# Patient Record
Sex: Female | Born: 1955 | Race: White | Hispanic: No | Smoking: Never smoker
Health system: Southern US, Community
[De-identification: ages and names within clinical notes are randomized; demographics above are authoritative.]

## PROBLEM LIST (undated history)

## (undated) HISTORY — PX: HERNIA REPAIR: SHX51

---

## 2016-10-22 ENCOUNTER — Encounter (HOSPITAL_COMMUNITY): Payer: Self-pay | Admitting: Emergency Medicine

## 2016-10-22 ENCOUNTER — Emergency Department (HOSPITAL_COMMUNITY): Payer: BLUE CROSS/BLUE SHIELD

## 2016-10-22 ENCOUNTER — Encounter (HOSPITAL_COMMUNITY): Admission: EM | Disposition: A | Discharge status: Home / Self Care | Payer: Self-pay | Source: Home / Self Care

## 2016-10-22 ENCOUNTER — Inpatient Hospital Stay (HOSPITAL_COMMUNITY)
Admission: EM | Admit: 2016-10-22 | Discharge: 2016-10-27 | DRG: 339 | Disposition: A | Discharge status: Home / Self Care | Payer: BLUE CROSS/BLUE SHIELD | Attending: Surgery | Admitting: Surgery

## 2016-10-22 ENCOUNTER — Observation Stay (HOSPITAL_COMMUNITY): Payer: BLUE CROSS/BLUE SHIELD | Admitting: Registered Nurse

## 2016-10-22 DIAGNOSIS — Z882 Allergy status to sulfonamides status: Secondary | ICD-10-CM

## 2016-10-22 DIAGNOSIS — N83202 Unspecified ovarian cyst, left side: Secondary | ICD-10-CM | POA: Diagnosis present

## 2016-10-22 DIAGNOSIS — K3580 Unspecified acute appendicitis: Secondary | ICD-10-CM | POA: Diagnosis present

## 2016-10-22 DIAGNOSIS — D1803 Hemangioma of intra-abdominal structures: Secondary | ICD-10-CM | POA: Diagnosis present

## 2016-10-22 DIAGNOSIS — K352 Acute appendicitis with generalized peritonitis: Principal | ICD-10-CM | POA: Diagnosis present

## 2016-10-22 DIAGNOSIS — K5900 Constipation, unspecified: Secondary | ICD-10-CM | POA: Diagnosis present

## 2016-10-22 DIAGNOSIS — Z8249 Family history of ischemic heart disease and other diseases of the circulatory system: Secondary | ICD-10-CM

## 2016-10-22 DIAGNOSIS — K353 Acute appendicitis with localized peritonitis, without perforation or gangrene: Secondary | ICD-10-CM

## 2016-10-22 DIAGNOSIS — R509 Fever, unspecified: Secondary | ICD-10-CM

## 2016-10-22 DIAGNOSIS — D696 Thrombocytopenia, unspecified: Secondary | ICD-10-CM | POA: Diagnosis present

## 2016-10-22 DIAGNOSIS — R1031 Right lower quadrant pain: Secondary | ICD-10-CM | POA: Diagnosis not present

## 2016-10-22 DIAGNOSIS — K3532 Acute appendicitis with perforation and localized peritonitis, without abscess: Secondary | ICD-10-CM

## 2016-10-22 DIAGNOSIS — N83201 Unspecified ovarian cyst, right side: Secondary | ICD-10-CM | POA: Diagnosis present

## 2016-10-22 DIAGNOSIS — J9811 Atelectasis: Secondary | ICD-10-CM | POA: Diagnosis not present

## 2016-10-22 DIAGNOSIS — I959 Hypotension, unspecified: Secondary | ICD-10-CM | POA: Diagnosis present

## 2016-10-22 HISTORY — PX: LAPAROSCOPIC APPENDECTOMY: SHX408

## 2016-10-22 LAB — COMPREHENSIVE METABOLIC PANEL
ALBUMIN: 4.4 g/dL (ref 3.5–5.0)
ALK PHOS: 57 U/L (ref 38–126)
ALT: 16 U/L (ref 14–54)
AST: 18 U/L (ref 15–41)
Anion gap: 8 (ref 5–15)
BILIRUBIN TOTAL: 2 mg/dL — AB (ref 0.3–1.2)
BUN: 11 mg/dL (ref 6–20)
CO2: 29 mmol/L (ref 22–32)
CREATININE: 0.77 mg/dL (ref 0.44–1.00)
Calcium: 9.5 mg/dL (ref 8.9–10.3)
Chloride: 102 mmol/L (ref 101–111)
GFR calc Af Amer: >60 mL/min (ref >60)
GLUCOSE: 140 mg/dL — AB (ref 65–99)
Potassium: 4.2 mmol/L (ref 3.5–5.1)
Sodium: 139 mmol/L (ref 135–145)
TOTAL PROTEIN: 7.2 g/dL (ref 6.5–8.1)

## 2016-10-22 LAB — CBC WITH DIFFERENTIAL/PLATELET
BASOS ABS: 0 10*3/uL (ref 0.0–0.1)
BASOS PCT: 0 %
Eosinophils Absolute: 0.1 10*3/uL (ref 0.0–0.7)
Eosinophils Relative: 1 %
HEMATOCRIT: 38.3 % (ref 36.0–46.0)
HEMOGLOBIN: 13.1 g/dL (ref 12.0–15.0)
LYMPHS PCT: 10 %
Lymphs Abs: 1 10*3/uL (ref 0.7–4.0)
MCH: 29.7 pg (ref 26.0–34.0)
MCHC: 34.2 g/dL (ref 30.0–36.0)
MCV: 86.8 fL (ref 78.0–100.0)
MONO ABS: 0.5 10*3/uL (ref 0.1–1.0)
Monocytes Relative: 5 %
NEUTROS ABS: 8.6 10*3/uL — AB (ref 1.7–7.7)
Neutrophils Relative %: 84 %
Platelets: 208 10*3/uL (ref 150–400)
RBC: 4.41 MIL/uL (ref 3.87–5.11)
RDW: 13 % (ref 11.5–15.5)
WBC: 10.2 10*3/uL (ref 4.0–10.5)

## 2016-10-22 LAB — URINALYSIS, ROUTINE W REFLEX MICROSCOPIC
BILIRUBIN URINE: NEGATIVE
GLUCOSE, UA: NEGATIVE mg/dL
HGB URINE DIPSTICK: NEGATIVE
KETONES UR: 5 mg/dL — AB
NITRITE: NEGATIVE
PH: 5 (ref 5.0–8.0)
Protein, ur: NEGATIVE mg/dL
Specific Gravity, Urine: 1.012 (ref 1.005–1.030)
Squamous Epithelial / LPF: NONE SEEN

## 2016-10-22 LAB — SURGICAL PCR SCREEN
MRSA, PCR: NEGATIVE
STAPHYLOCOCCUS AUREUS: NEGATIVE

## 2016-10-22 LAB — LIPASE, BLOOD: LIPASE: 16 U/L (ref 11–51)

## 2016-10-22 SURGERY — APPENDECTOMY, LAPAROSCOPIC
Anesthesia: General | Site: Abdomen

## 2016-10-22 MED ORDER — METRONIDAZOLE IN NACL 5-0.79 MG/ML-% IV SOLN
500.0000 mg | Freq: Three times a day (TID) | INTRAVENOUS | Status: DC
  Administered 2016-10-22: 500 mg via INTRAVENOUS

## 2016-10-22 MED ORDER — ONDANSETRON 4 MG PO TBDP: 4.0000 mg | ORAL_TABLET | Freq: Four times a day (QID) | ORAL | Status: DC | PRN

## 2016-10-22 MED ORDER — DIPHENHYDRAMINE HCL 12.5 MG/5ML PO ELIX: 12.5000 mg | ORAL_SOLUTION | Freq: Four times a day (QID) | ORAL | Status: DC | PRN

## 2016-10-22 MED ORDER — ONDANSETRON HCL 4 MG/2ML IJ SOLN
INTRAMUSCULAR | Status: DC | PRN
  Administered 2016-10-22: 4 mg via INTRAVENOUS

## 2016-10-22 MED ORDER — IOPAMIDOL (ISOVUE-300) INJECTION 61%
100.0000 mL | Freq: Once | INTRAVENOUS | Status: AC | PRN
  Administered 2016-10-22: 100 mL via INTRAVENOUS

## 2016-10-22 MED ORDER — KCL IN DEXTROSE-NACL 20-5-0.45 MEQ/L-%-% IV SOLN
INTRAVENOUS | Status: DC
  Administered 2016-10-22 – 2016-10-26 (×8): via INTRAVENOUS
  Filled 2016-10-22 (×9): qty 1000

## 2016-10-22 MED ORDER — SUGAMMADEX SODIUM 200 MG/2ML IV SOLN
INTRAVENOUS | Status: DC | PRN
  Administered 2016-10-22: 200 mg via INTRAVENOUS

## 2016-10-22 MED ORDER — IOPAMIDOL (ISOVUE-300) INJECTION 61%
INTRAVENOUS | Status: AC
  Administered 2016-10-22: 100 mL via INTRAVENOUS
  Filled 2016-10-22: qty 100

## 2016-10-22 MED ORDER — 0.9 % SODIUM CHLORIDE (POUR BTL) OPTIME
TOPICAL | Status: DC | PRN
  Administered 2016-10-22: 2000 mL

## 2016-10-22 MED ORDER — ROCURONIUM BROMIDE 100 MG/10ML IV SOLN
INTRAVENOUS | Status: DC | PRN
  Administered 2016-10-22: 50 mg via INTRAVENOUS

## 2016-10-22 MED ORDER — PROPOFOL 10 MG/ML IV BOLUS
INTRAVENOUS | Status: DC | PRN
  Administered 2016-10-22: 200 mg via INTRAVENOUS

## 2016-10-22 MED ORDER — SUCCINYLCHOLINE CHLORIDE 20 MG/ML IJ SOLN
INTRAMUSCULAR | Status: DC | PRN
  Administered 2016-10-22: 120 mg via INTRAVENOUS

## 2016-10-22 MED ORDER — DEXTROSE 5 % IV SOLN
2.0000 g | Freq: Once | INTRAVENOUS | Status: AC
  Administered 2016-10-22: 2 g via INTRAVENOUS
  Filled 2016-10-22: qty 2

## 2016-10-22 MED ORDER — ACETAMINOPHEN 160 MG/5ML PO SOLN
ORAL | Status: AC
  Administered 2016-10-22: 650 mg
  Filled 2016-10-22: qty 20.3

## 2016-10-22 MED ORDER — HYDROCODONE-ACETAMINOPHEN 5-325 MG PO TABS
1.0000 | ORAL_TABLET | ORAL | Status: DC | PRN
Start: 2016-10-22 — End: 2016-10-27
  Administered 2016-10-22 – 2016-10-25 (×7): 1 via ORAL
  Filled 2016-10-22 (×7): qty 1

## 2016-10-22 MED ORDER — ONDANSETRON HCL 4 MG/2ML IJ SOLN
4.0000 mg | Freq: Four times a day (QID) | INTRAMUSCULAR | Status: DC | PRN
Start: 2016-10-22 — End: 2016-10-27

## 2016-10-22 MED ORDER — LACTATED RINGERS IR SOLN
Status: DC | PRN
  Administered 2016-10-22: 1000 mL

## 2016-10-22 MED ORDER — ONDANSETRON HCL 4 MG/2ML IJ SOLN
INTRAMUSCULAR | Status: AC
  Filled 2016-10-22: qty 2

## 2016-10-22 MED ORDER — METRONIDAZOLE IN NACL 5-0.79 MG/ML-% IV SOLN
500.0000 mg | Freq: Once | INTRAVENOUS | Status: AC
  Administered 2016-10-22: 500 mg via INTRAVENOUS
  Filled 2016-10-22: qty 100

## 2016-10-22 MED ORDER — BUPIVACAINE HCL (PF) 0.25 % IJ SOLN
INTRAMUSCULAR | Status: DC | PRN
  Administered 2016-10-22: 30 mL

## 2016-10-22 MED ORDER — MORPHINE SULFATE (PF) 4 MG/ML IV SOLN
4.0000 mg | Freq: Once | INTRAVENOUS | Status: AC
  Administered 2016-10-22: 4 mg via INTRAVENOUS
  Filled 2016-10-22: qty 1

## 2016-10-22 MED ORDER — LIDOCAINE HCL (CARDIAC) 20 MG/ML IV SOLN
INTRAVENOUS | Status: DC | PRN
  Administered 2016-10-22: 75 mg via INTRAVENOUS
  Administered 2016-10-22: 25 mg via INTRATRACHEAL

## 2016-10-22 MED ORDER — DEXAMETHASONE SODIUM PHOSPHATE 10 MG/ML IJ SOLN
INTRAMUSCULAR | Status: DC | PRN
  Administered 2016-10-22: 10 mg via INTRAVENOUS

## 2016-10-22 MED ORDER — PROPOFOL 10 MG/ML IV BOLUS
INTRAVENOUS | Status: AC
  Filled 2016-10-22: qty 40

## 2016-10-22 MED ORDER — MIDAZOLAM HCL 2 MG/2ML IJ SOLN
INTRAMUSCULAR | Status: AC
  Filled 2016-10-22: qty 2

## 2016-10-22 MED ORDER — DEXAMETHASONE SODIUM PHOSPHATE 10 MG/ML IJ SOLN
INTRAMUSCULAR | Status: AC
  Filled 2016-10-22: qty 1

## 2016-10-22 MED ORDER — SUFENTANIL CITRATE 50 MCG/ML IV SOLN
INTRAVENOUS | Status: DC | PRN
  Administered 2016-10-22 (×2): 10 ug via INTRAVENOUS
  Administered 2016-10-22: 5 ug via INTRAVENOUS

## 2016-10-22 MED ORDER — SCOPOLAMINE 1 MG/3DAYS TD PT72
MEDICATED_PATCH | TRANSDERMAL | Status: AC
  Filled 2016-10-22: qty 1

## 2016-10-22 MED ORDER — BUPIVACAINE HCL (PF) 0.25 % IJ SOLN
INTRAMUSCULAR | Status: AC
  Filled 2016-10-22: qty 30

## 2016-10-22 MED ORDER — ORAL CARE MOUTH RINSE
15.0000 mL | Freq: Two times a day (BID) | OROMUCOSAL | Status: DC
  Administered 2016-10-22 – 2016-10-24 (×4): 15 mL via OROMUCOSAL

## 2016-10-22 MED ORDER — SUGAMMADEX SODIUM 200 MG/2ML IV SOLN
INTRAVENOUS | Status: AC
  Filled 2016-10-22: qty 2

## 2016-10-22 MED ORDER — LACTATED RINGERS IV SOLN
INTRAVENOUS | Status: DC
  Administered 2016-10-22 (×2): via INTRAVENOUS

## 2016-10-22 MED ORDER — SUFENTANIL CITRATE 50 MCG/ML IV SOLN
INTRAVENOUS | Status: AC
  Filled 2016-10-22: qty 1

## 2016-10-22 MED ORDER — METRONIDAZOLE IN NACL 5-0.79 MG/ML-% IV SOLN
INTRAVENOUS | Status: AC
  Filled 2016-10-22: qty 100

## 2016-10-22 MED ORDER — CLONAZEPAM 0.5 MG PO TABS
0.2500 mg | ORAL_TABLET | Freq: Every evening | ORAL | Status: DC | PRN
  Administered 2016-10-24 – 2016-10-26 (×3): 0.25 mg via ORAL
  Filled 2016-10-22 (×3): qty 1

## 2016-10-22 MED ORDER — MORPHINE SULFATE (PF) 4 MG/ML IV SOLN: 1.0000 mg | INTRAVENOUS | Status: DC | PRN

## 2016-10-22 MED ORDER — MIDAZOLAM HCL 5 MG/5ML IJ SOLN
INTRAMUSCULAR | Status: DC | PRN
  Administered 2016-10-22: 2 mg via INTRAVENOUS

## 2016-10-22 MED ORDER — SCOPOLAMINE 1 MG/3DAYS TD PT72
1.0000 | MEDICATED_PATCH | TRANSDERMAL | Status: DC
  Administered 2016-10-22: 1.5 mg via TRANSDERMAL
  Filled 2016-10-22: qty 1

## 2016-10-22 MED ORDER — CEFTRIAXONE SODIUM 2 G IJ SOLR: 2.0000 g | INTRAMUSCULAR | Status: DC

## 2016-10-22 MED ORDER — ONDANSETRON HCL 4 MG/2ML IJ SOLN
4.0000 mg | Freq: Once | INTRAMUSCULAR | Status: AC
  Administered 2016-10-22: 4 mg via INTRAVENOUS
  Filled 2016-10-22: qty 2

## 2016-10-22 MED ORDER — HYDROMORPHONE HCL 1 MG/ML IJ SOLN: 0.5000 mg | INTRAMUSCULAR | Status: DC | PRN

## 2016-10-22 MED ORDER — DIPHENHYDRAMINE HCL 50 MG/ML IJ SOLN: 12.5000 mg | Freq: Four times a day (QID) | INTRAMUSCULAR | Status: DC | PRN

## 2016-10-22 MED ORDER — PIPERACILLIN-TAZOBACTAM 3.375 G IVPB
3.3750 g | Freq: Three times a day (TID) | INTRAVENOUS | Status: DC
  Administered 2016-10-22 – 2016-10-27 (×15): 3.375 g via INTRAVENOUS
  Filled 2016-10-22 (×16): qty 50

## 2016-10-22 MED ORDER — SODIUM CHLORIDE 0.9 % IV BOLUS (SEPSIS)
1000.0000 mL | Freq: Once | INTRAVENOUS | Status: AC
  Administered 2016-10-22: 1000 mL via INTRAVENOUS

## 2016-10-22 SURGICAL SUPPLY — 35 items
APPLIER CLIP ROT 10 11.4 M/L (STAPLE)
BENZOIN TINCTURE PRP APPL 2/3 (GAUZE/BANDAGES/DRESSINGS) IMPLANT
CABLE HIGH FREQUENCY MONO STRZ (ELECTRODE) ×3 IMPLANT
CHLORAPREP W/TINT 26ML (MISCELLANEOUS) ×3 IMPLANT
CLIP APPLIE ROT 10 11.4 M/L (STAPLE) IMPLANT
CLOSURE WOUND 1/2 X4 (GAUZE/BANDAGES/DRESSINGS)
COVER SURGICAL LIGHT HANDLE (MISCELLANEOUS) ×3 IMPLANT
CUTTER FLEX LINEAR 45M (STAPLE) ×3 IMPLANT
DECANTER SPIKE VIAL GLASS SM (MISCELLANEOUS) IMPLANT
DERMABOND ADVANCED (GAUZE/BANDAGES/DRESSINGS) ×2
DERMABOND ADVANCED .7 DNX12 (GAUZE/BANDAGES/DRESSINGS) ×1 IMPLANT
DRAPE LAPAROSCOPIC ABDOMINAL (DRAPES) ×3 IMPLANT
ELECT REM PT RETURN 15FT ADLT (MISCELLANEOUS) ×3 IMPLANT
ENDOLOOP SUT PDS II  0 18 (SUTURE)
ENDOLOOP SUT PDS II 0 18 (SUTURE) IMPLANT
GLOVE SURG SIGNA 7.5 PF LTX (GLOVE) ×3 IMPLANT
GOWN STRL REUS W/TWL XL LVL3 (GOWN DISPOSABLE) ×9 IMPLANT
IRRIG SUCT STRYKERFLOW 2 WTIP (MISCELLANEOUS)
IRRIGATION SUCT STRKRFLW 2 WTP (MISCELLANEOUS) IMPLANT
KIT BASIN OR (CUSTOM PROCEDURE TRAY) ×3 IMPLANT
POUCH SPECIMEN RETRIEVAL 10MM (ENDOMECHANICALS) ×3 IMPLANT
RELOAD 45 VASCULAR/THIN (ENDOMECHANICALS) IMPLANT
RELOAD STAPLE TA45 3.5 REG BLU (ENDOMECHANICALS) ×3 IMPLANT
SCISSORS LAP 5X35 DISP (ENDOMECHANICALS) ×3 IMPLANT
SET IRRIG TUBING LAPAROSCOPIC (IRRIGATION / IRRIGATOR) ×3 IMPLANT
SHEARS HARMONIC ACE PLUS 36CM (ENDOMECHANICALS) ×3 IMPLANT
SLEEVE XCEL OPT CAN 5 100 (ENDOMECHANICALS) ×3 IMPLANT
STRIP CLOSURE SKIN 1/2X4 (GAUZE/BANDAGES/DRESSINGS) IMPLANT
SUT MNCRL AB 4-0 PS2 18 (SUTURE) ×3 IMPLANT
SUT VIC AB 2-0 SH 18 (SUTURE) IMPLANT
TOWEL OR 17X26 10 PK STRL BLUE (TOWEL DISPOSABLE) ×3 IMPLANT
TOWEL OR NON WOVEN STRL DISP B (DISPOSABLE) ×3 IMPLANT
TRAY LAPAROSCOPIC (CUSTOM PROCEDURE TRAY) ×3 IMPLANT
TROCAR BLADELESS OPT 5 100 (ENDOMECHANICALS) ×3 IMPLANT
TROCAR XCEL BLUNT TIP 100MML (ENDOMECHANICALS) ×3 IMPLANT

## 2016-10-22 NOTE — ED Notes (Signed)
Patient transported to CT 

## 2016-10-22 NOTE — ED Notes (Signed)
Surgical PA at bedside. Pt ok'd to have ice chips. 1/4 of cup of ice chips given to pt.

## 2016-10-22 NOTE — Anesthesia Preprocedure Evaluation (Signed)
Anesthesia Evaluation  Patient identified by MRN, date of birth, ID band Patient awake    Reviewed: Allergy & Precautions, NPO status , Patient's Chart, lab work & pertinent test results  Airway Mallampati: II  TM Distance: >3 FB Neck ROM: Full    Dental no notable dental hx. (+) Dental Advisory Given   Pulmonary neg pulmonary ROS,    Pulmonary exam normal        Cardiovascular negative cardio ROS Normal cardiovascular exam     Neuro/Psych negative neurological ROS  negative psych ROS   GI/Hepatic Neg liver ROS,   Endo/Other  negative endocrine ROS  Renal/GU negative Renal ROS  negative genitourinary   Musculoskeletal negative musculoskeletal ROS (+)   Abdominal   Peds negative pediatric ROS (+)  Hematology negative hematology ROS (+)   Anesthesia Other Findings   Reproductive/Obstetrics negative OB ROS                             Anesthesia Physical Anesthesia Plan  ASA: II and emergent  Anesthesia Plan: General   Post-op Pain Management:    Induction: Intravenous, Rapid sequence and Cricoid pressure planned  Airway Management Planned: Oral ETT  Additional Equipment:   Intra-op Plan:   Post-operative Plan: Extubation in OR  Informed Consent: I have reviewed the patients History and Physical, chart, labs and discussed the procedure including the risks, benefits and alternatives for the proposed anesthesia with the patient or authorized representative who has indicated his/her understanding and acceptance.   Dental advisory given  Plan Discussed with: CRNA, Anesthesiologist and Surgeon  Anesthesia Plan Comments:         Anesthesia Quick Evaluation

## 2016-10-22 NOTE — ED Triage Notes (Signed)
Pt states she has had an achy pain on her right side for a couple days but about an hour ago the pain became very intense and sharp  Pt has nausea and vomiting with the pain

## 2016-10-22 NOTE — Op Note (Signed)
Re:   Gabrielle Price DOB:   1955/08/10 MRN:   [id number]                   FACILITY:  [facility]  DATE OF PROCEDURE: 10/22/2016                              OPERATIVE REPORT  PREOPERATIVE DIAGNOSIS:  Appendicitis  POSTOPERATIVE DIAGNOSIS:  Acute perforated appendicitis with pus in pelvis.  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  Right ovary with benign appearing cyst. [photos in chart]  PROCEDURE:  Laparoscopic appendectomy.  SURGEON:  Fenton Malling. Lucia Gaskins, MD  ASSISTANT:  No first assistant.  ANESTHESIA:  General endotracheal.  Anesthesiologist: Duane Boston, MD CRNA: Lissa Morales, CRNA  ASA:  2E  ESTIMATED BLOOD LOSS:  Minimal.  DRAINS: none   SPECIMEN:   Appendix  COUNTS CORRECT:  YES  INDICATIONS FOR PROCEDURE: Gabrielle Price is a 61 y.o. (DOB: [birth year]) white female whose primary care doctor is Pcp Not In System and comes to the OR for an appendectomy.  Dr. Hassell Done, our surgeon of the week, was occupied doing other cases.  She is visiting from [place] for her twin daughters who are in a swim meet.  I discussed with the patient, the indications and potential complications of appendiceal surgery.  The potential complications include, but are not limited to, bleeding, open surgery, bowel resection, and the possibility of another diagnosis.  OPERATIVE NOTE:  The patient underwent a general endotracheal anesthetic as supervised by Anesthesiologist: Duane Boston, MD CRNA: Lissa Morales, CRNA, General, in room #1.  The patient was given Rocephin and Flagyl prior to the beginning of the procedure and the abdomen was prepped with ChloraPrep.   A time-out was held and surgical checklist run.  An infraumbilical incision was made with sharp dissection carried down to the abdominal cavity.  An 12 mm Hasson trocar was inserted through the infraumbilical incision and into the peritoneal cavity.  A 0 degree 10 mm laparoscope was inserted through a 12 mm Hasson trocar and the Hasson trocar secured  with a 0 Vicryl suture.  I placed a 5 mm trocar in the right upper quadrant and 5 mm torcar in left lower quadrant and did abdominal exploration.    The right and left lobes of liver demonstrated hemangiomas.  This correlates with the CT scan findings  Photos were taken.  Stomach was unremarkable.  She had a 4 to 5 cm right ovary that was cystic, but otherwise looked okay.  Her left ovary was cystic and about 3 cm.  Photos were taken of the ovaries.  The patient had appendicitis with the appendix located lateral and retrocecal.  The appendix was densely adhered to the lateral cecum/right colon.  I as able to free up the appendix and identify the mesentery.  The mesentery of the appendix was divided with a Harmonic scalpel.  The appendix was perforated about 1/3 from the base.  She had pus in her pelvis from the ruptured appendix.  I got to the base of the appendix.  I then used a blue load 45 mm Ethicon Endo-GIA stapler and fired this across the base of the appendix.  I placed the appendix in EndoCatch bag and delivered the bag through the umbilical incision.  I irrigated the abdomen with 2,000 cc of saline.  The saline was clear at the end of irrigation.  After irrigating the abdomen,  I then removed the trocars, in turn.  The umbilical port fascia was closed with 0 Vicryl suture.   I closed the skin each site with a 4-0 Monocryl suture and painted the wounds with DermaBond.  I then injected a total of 30 mL of 0.25% Marcaine at the incisions.  Sponge and needle count were correct at the end of the case.  The patient was transferred to the recovery room in good condition.  The patient tolerated the procedure well and it depends on the patient's post op clinical course as to when the patient could be discharged.  Photos were taken and given to the daughter for further reference.  Photos were placed in the chart.   Alphonsa Overall, MD, [facility] Surgery Pager: [phone] Office phone:   [phone]

## 2016-10-22 NOTE — Anesthesia Postprocedure Evaluation (Addendum)
Anesthesia Post Note  Patient: Gabrielle Price  Procedure(s) Performed: Procedure(s) (LRB): APPENDECTOMY LAPAROSCOPIC (N/A)  Patient location during evaluation: PACU Anesthesia Type: General Level of consciousness: sedated Pain management: pain level controlled Vital Signs Assessment: post-procedure vital signs reviewed and stable Respiratory status: spontaneous breathing and respiratory function stable Cardiovascular status: stable Anesthetic complications: no       Last Vitals:  Vitals:   10/22/16 1430 10/22/16 1447  BP: 93/76 (!) 92/47  Pulse: 91   Resp: 20 20  Temp: 37.9 C 37.1 C    Last Pain:  Vitals:   10/22/16 1430  TempSrc:   PainSc: 0-No pain                 Ho Parisi DANIEL

## 2016-10-22 NOTE — ED Provider Notes (Signed)
[facility] DEPT Provider Note   CSN: [id number] Arrival date & time: 10/22/16  0356     History   Chief Complaint Chief Complaint  Patient presents with  . Flank Pain    HPI Gabrielle Price is a 61 y.o. female.  Patient presents with right-sided abdominal pain has been intermittent for the past several days but became acutely worse tonight. Associated with nausea and dry heaving. Patient is visiting from out of town about this pain was due to constipation. She reports the pain is sharp and stabbing and has became severe and woke her from sleep tonight. Multiple episodes of dry heaving but no vomiting. Denies any urinary symptoms or vaginal symptoms. No fever. Last bowel movement was yesterday morning. Denies any history of kidney stones. She still has an appendix.   The history is provided by the patient.  Flank Pain  Associated symptoms include abdominal pain. Pertinent negatives include no chest pain, no headaches and no shortness of breath.    History reviewed. No pertinent past medical history.  There are no active problems to display for this patient.   Past Surgical History:  Procedure Laterality Date  . CESAREAN SECTION    . HERNIA REPAIR      OB History    No data available       Home Medications    Prior to Admission medications   Not on File    Family History Family History  Problem Relation Age of Onset  . Heart attack Mother   . Cancer Other     Social History Social History  Substance Use Topics  . Smoking status: Never Smoker  . Smokeless tobacco: Never Used  . Alcohol use No     Allergies   Sulfa antibiotics   Review of Systems Review of Systems  Constitutional: Positive for activity change, appetite change and fatigue. Negative for fever.  HENT: Negative for congestion and rhinorrhea.   Eyes: Negative for visual disturbance.  Respiratory: Negative for cough, chest tightness and shortness of breath.   Cardiovascular: Negative  for chest pain.  Gastrointestinal: Positive for abdominal pain and nausea. Negative for diarrhea and vomiting.  Genitourinary: Positive for flank pain. Negative for dysuria, vaginal bleeding and vaginal discharge.  Musculoskeletal: Negative for arthralgias, back pain and joint swelling.  Skin: Negative for rash and wound.  Neurological: Negative for dizziness, weakness, light-headedness and headaches.   A complete 10 system review of systems was obtained and all systems are negative except as noted in the HPI and PMH.    Physical Exam Updated Vital Signs BP 97/77 (BP Location: Left Arm)   Pulse 95   Resp (!) 24   Ht 5\' 6"  (1.676 m)   Wt 135 lb (61.2 kg)   SpO2 95%   BMI 21.79 kg/m   Physical Exam  Constitutional: She is oriented to person, place, and time. She appears well-developed and well-nourished. No distress.  HENT:  Head: Normocephalic and atraumatic.  Mouth/Throat: Oropharynx is clear and moist. No oropharyngeal exudate.  Eyes: Conjunctivae and EOM are normal. Pupils are equal, round, and reactive to light.  Neck: Normal range of motion. Neck supple.  No meningismus.  Cardiovascular: Normal rate, regular rhythm, normal heart sounds and intact distal pulses.   No murmur heard. Pulmonary/Chest: Effort normal and breath sounds normal. No respiratory distress.  Abdominal: Soft. There is tenderness. There is no rebound and no guarding.  TTP RLQ with guarding  Musculoskeletal: Normal range of motion. She exhibits no edema or  tenderness.  No CVAT  Neurological: She is alert and oriented to person, place, and time. No cranial nerve deficit. She exhibits normal muscle tone. Coordination normal.  No ataxia on finger to nose bilaterally. No pronator drift. 5/5 strength throughout. CN 2-12 intact.Equal grip strength. Sensation intact.   Skin: Skin is warm.  Psychiatric: She has a normal mood and affect. Her behavior is normal.  Nursing note and vitals reviewed.    ED  Treatments / Results  Labs (all labs ordered are listed, but only abnormal results are displayed) Labs Reviewed  URINALYSIS, ROUTINE W REFLEX MICROSCOPIC  CBC WITH DIFFERENTIAL/PLATELET  COMPREHENSIVE METABOLIC PANEL  LIPASE, BLOOD    EKG  EKG Interpretation None       Radiology Ct Abdomen Pelvis W Contrast  Result Date: 10/22/2016 CLINICAL DATA:  61 year old female with right lower quadrant pain for 2 days. Initial encounter. EXAM: CT ABDOMEN AND PELVIS WITH CONTRAST TECHNIQUE: Multidetector CT imaging of the abdomen and pelvis was performed using the standard protocol following bolus administration of intravenous contrast. CONTRAST:  100 cc Isovue 300. COMPARISON:  None. FINDINGS: Lower chest: Basilar subsegmental atelectasis. Heart size within normal limits. Limited evaluation of breast parenchyma. Hepatobiliary: Enlarged liver spanning over are 20.8 cm. Multiple liver lesions are noted. Two liver cysts, largest measures up to 4.8 cm. Six liver lesions with characteristics suggest possibility of hemangiomas measuring up to 5.4 cm. Inferior right lobe liver 1.5 cm lesion probably a hemangioma however, elective MR of the liver recommended to confirm this is a hemangioma at which time, attention to the left lateral aspect of the left lobe of the liver recommended as there is a nodular appearance and underlying 3.6 cm mass cannot be excluded. No calcified gallstone. Pancreas: Top-normal size common bile duct and pancreatic duct without pancreatic mass noted. Spleen: No splenic mass or enlargement. Adrenals/Urinary Tract: No renal or ureteral obstructing stone or hydronephrosis. Tiny low-density renal structures may be cysts but too small to characterize. No adrenal lesion. Noncontrast filled views of the urinary bladder unremarkable. Stomach/Bowel: Diffuse inflammatory process involving the appendix, cecum and terminal ileum. My suspicion is that this is related to appendicitis with secondary  involvement of the cecum and terminal ileum. Per discussion with Dr. Wyvonnia Dusky, patient is presenting with symptoms suspicious for appendicitis. A less likely consideration is inflammatory process such as Crohn's disease of the terminal ileum and cecum with secondary inflammation of the appendix. Portions of stomach under distended. Evaluation of sigmoid colon limited by free fluid in the pelvis. Vascular/Lymphatic: No aortic aneurysm or large vessel occlusion. Ectatic celiac artery. Top-normal size lymph nodes porta hepatis/celiac axis. Reproductive: Retroverted uterus. Ovarian cysts larger on the right. Given the patient's age, pelvic sonogram on elective basis recommended to evaluate ovarian cystic structures. Prominent gonadal vessels greater on the left. This can be seen with pelvic venous engorgement. Other: Free fluid in the pelvis may be related to the right lower quadrant inflammatory process without free air. Post hernia repair left inguinal region. Musculoskeletal: Right iliac wing nonspecific lung lesion measuring up to 3.5 cm. IMPRESSION: Diffuse inflammatory process involving the appendix, cecum and terminal ileum. My suspicion is that this is related to appendicitis with secondary involvement of the cecum and terminal ileum. A less likely consideration is inflammatory process such as Crohn's disease of the terminal ileum and cecum with secondary inflammation of the appendix. Free fluid in the pelvis may be related to the right lower quadrant inflammatory process. Enlarged liver with multiple liver lesions including  2 cysts and 6 hemangiomas. Inferior right lobe of liver 1.5 cm lesion probably a hemangioma however, elective MR of the liver recommended to confirm this is a hemangioma at which time, attention to the left lateral aspect of the left lobe of the liver recommended as there is a nodular appearance and underlying 3.6 cm mass cannot be excluded. Ovarian cysts larger on the right. Given the  patient's age, pelvic sonogram on elective basis recommended to evaluate ovarian cystic structures. Prominent gonadal vessels greater on the left. This can be seen with pelvic venous engorgement. Right iliac wing nonspecific lung lesion measuring up to 3.5 cm. Post hernia repair left inguinal region. These results were called by telephone at the time of interpretation on 10/22/2016 at 6:53 am to Dr. Ezequiel Essex , who verbally acknowledged these results. Electronically Signed   By: Genia Del M.D.   On: 10/22/2016 07:24    Procedures Procedures (including critical care time)  Medications Ordered in ED Medications  ondansetron (ZOFRAN) injection 4 mg (not administered)  morphine 4 MG/ML injection 4 mg (not administered)  sodium chloride 0.9 % bolus 1,000 mL (not administered)     Initial Impression / Assessment and Plan / ED Course  I have reviewed the triage vital signs and the nursing notes.  Pertinent labs & imaging results that were available during my care of the patient were reviewed by me and considered in my medical decision making (see chart for details).     R sided abdominal pain with dry heaving.  Concern for appendix versus kidney stone.  Urinalysis does not show any blood. CT scan will be obtained to evaluate for appendicitis.  Discussed with radiology Dr. Irish Elders. There is inflammatory process in RLQ likely originating from appendicitis as well as inflammation of cecum and ileum.  D/w Dr. Hassell Done who will evaluate.  IV antibiotics started.   Final Clinical Impressions(s) / ED Diagnoses   Final diagnoses:  Acute appendicitis with localized peritonitis    New Prescriptions New Prescriptions   No medications on file     Ezequiel Essex, MD 10/22/16 [id number]

## 2016-10-22 NOTE — Anesthesia Procedure Notes (Addendum)
Procedure Name: Intubation Date/Time: 10/22/2016 12:39 PM Performed by: Lissa Morales Pre-anesthesia Checklist: Patient identified, Emergency Drugs available, Suction available and Patient being monitored Patient Re-evaluated:Patient Re-evaluated prior to inductionOxygen Delivery Method: Circle system utilized Preoxygenation: Pre-oxygenation with 100% oxygen Intubation Type: IV induction, Cricoid Pressure applied and Rapid sequence Ventilation: Mask ventilation without difficulty Laryngoscope Size: Mac and 4 Grade View: Grade I Tube type: Oral Tube size: 7.0 mm Number of attempts: 1 Airway Equipment and Method: Stylet and Oral airway Placement Confirmation: ETT inserted through vocal cords under direct vision,  positive ETCO2 and breath sounds checked- equal and bilateral Secured at: 21 cm Tube secured with: Tape Dental Injury: Teeth and Oropharynx as per pre-operative assessment

## 2016-10-22 NOTE — H&P (Signed)
[facility] Admission Note  Gabrielle Price 09/15/1955  [id number].    Requesting MD: Wyvonnia Dusky, MD  Chief Complaint/Reason for Consult: acute appendicitis   HPI:  61 y/o female with a medical history significant for constipation who presented to [facility] with right sided abdominal pain. She reports dull, constant, right lower quadrant pain that started 48 hours ago but became acutely worse and sharp this morning, waking her up from her sleep. This morning she also developed nausea and dry heaving. Her last BM was yesterday and at baseline she has 2-3 BMs weekly that she describes as soft and brown. Denies personal or family history of colon cancer or Gynecologic abnormalities. She denies previous complications undergoing general anesthesia and reports she is allergic to Sulfa.   Surgical history: cesarean section, left inguinal hernia repair, ventral hernia repair. Tobacco use: none  EtOH use: none  Blood thinning medications: none   ED workup significant for CT Abd/pelvis revealing diffuse inflammatory process involving the appendix, cecum and terminal ileum. Also noted on CT are multiple liver and ovarian cysts. There is some free fluid in the pelvis suspect 2/2 RLQ inflammatory process. UA, CMET, AND CBC are unremarkable.   ROS: Review of Systems  Constitutional: Negative for chills and fever.  Eyes: Negative.   Respiratory: Negative for cough and shortness of breath.   Cardiovascular: Negative for chest pain, palpitations and orthopnea.  Gastrointestinal: Positive for abdominal pain, constipation and nausea. Negative for blood in stool, diarrhea, melena and vomiting.  Genitourinary: Negative.   Musculoskeletal: Negative.   Neurological: Negative.   All other systems reviewed and are negative.   Family History  Problem Relation Age of Onset  . Heart attack Mother   . Cancer Other     History reviewed. No pertinent past medical history.  Past Surgical History:   Procedure Laterality Date  . CESAREAN SECTION    . HERNIA REPAIR      Social History:  reports that she has never smoked. She has never used smokeless tobacco. She reports that she does not drink alcohol or use drugs.  Allergies:  Allergies  Allergen Reactions  . Sulfa Antibiotics Rash     (Not in a hospital admission)  Blood pressure 119/75, pulse 89, resp. rate 18, height 5' 6"  (1.676 m), weight 61.2 kg (135 lb), SpO2 96 %. Physical Exam: Physical Exam  Constitutional: She is oriented to person, place, and time. She appears well-developed and well-nourished. No distress.  HENT:  Head: Normocephalic and atraumatic.  Eyes: EOM are normal. Pupils are equal, round, and reactive to light. No scleral icterus.  Neck: Normal range of motion. Neck supple. No tracheal deviation present. No thyromegaly present.  Cardiovascular: Regular rhythm and intact distal pulses.   Sinus tachycardia.  Pulmonary/Chest: Effort normal and breath sounds normal. No respiratory distress. She has no wheezes. She has no rales. She exhibits no tenderness.  Abdominal: Soft. She exhibits no distension and no mass. There is tenderness (RLQ and LLQ tenderness without peritonitis ). There is no rebound and no guarding.  Musculoskeletal: Normal range of motion. She exhibits no edema or deformity.  Neurological: She is alert and oriented to person, place, and time.  Skin: Skin is warm and dry. She is not diaphoretic.    Results for orders placed or performed during the hospital encounter of 10/22/16 (from the past 48 hour(s))  CBC with Differential/Platelet     Status: Abnormal   Collection Time: 10/22/16  4:35 AM  Result Value Ref Range  WBC 10.2 4.0 - 10.5 K/uL   RBC 4.41 3.87 - 5.11 MIL/uL   Hemoglobin 13.1 12.0 - 15.0 g/dL   HCT 38.3 36.0 - 46.0 %   MCV 86.8 78.0 - 100.0 fL   MCH 29.7 26.0 - 34.0 pg   MCHC 34.2 30.0 - 36.0 g/dL   RDW 13.0 11.5 - 15.5 %   Platelets 208 150 - 400 K/uL   Neutrophils  Relative % 84 %   Neutro Abs 8.6 (H) 1.7 - 7.7 K/uL   Lymphocytes Relative 10 %   Lymphs Abs 1.0 0.7 - 4.0 K/uL   Monocytes Relative 5 %   Monocytes Absolute 0.5 0.1 - 1.0 K/uL   Eosinophils Relative 1 %   Eosinophils Absolute 0.1 0.0 - 0.7 K/uL   Basophils Relative 0 %   Basophils Absolute 0.0 0.0 - 0.1 K/uL  Comprehensive metabolic panel     Status: Abnormal   Collection Time: 10/22/16  4:35 AM  Result Value Ref Range   Sodium 139 135 - 145 mmol/L   Potassium 4.2 3.5 - 5.1 mmol/L   Chloride 102 101 - 111 mmol/L   CO2 29 22 - 32 mmol/L   Glucose, Bld 140 (H) 65 - 99 mg/dL   BUN 11 6 - 20 mg/dL   Creatinine, Ser 0.77 0.44 - 1.00 mg/dL   Calcium 9.5 8.9 - 10.3 mg/dL   Total Protein 7.2 6.5 - 8.1 g/dL   Albumin 4.4 3.5 - 5.0 g/dL   AST 18 15 - 41 U/L   ALT 16 14 - 54 U/L   Alkaline Phosphatase 57 38 - 126 U/L   Total Bilirubin 2.0 (H) 0.3 - 1.2 mg/dL   GFR calc non Af Amer >60 >60 mL/min   GFR calc Af Amer >60 >60 mL/min    Comment: (NOTE) The eGFR has been calculated using the CKD EPI equation. This calculation has not been validated in all clinical situations. eGFR's persistently <60 mL/min signify possible Chronic Kidney Disease.    Anion gap 8 5 - 15  Lipase, blood     Status: None   Collection Time: 10/22/16  4:35 AM  Result Value Ref Range   Lipase 16 11 - 51 U/L  Urinalysis, Routine w reflex microscopic- may I&O cath if menses     Status: Abnormal   Collection Time: 10/22/16  6:45 AM  Result Value Ref Range   Color, Urine YELLOW YELLOW   APPearance HAZY (A) CLEAR   Specific Gravity, Urine 1.012 1.005 - 1.030   pH 5.0 5.0 - 8.0   Glucose, UA NEGATIVE NEGATIVE mg/dL   Hgb urine dipstick NEGATIVE NEGATIVE   Bilirubin Urine NEGATIVE NEGATIVE   Ketones, ur 5 (A) NEGATIVE mg/dL   Protein, ur NEGATIVE NEGATIVE mg/dL   Nitrite NEGATIVE NEGATIVE   Leukocytes, UA TRACE (A) NEGATIVE   RBC / HPF 0-5 0 - 5 RBC/hpf   WBC, UA 6-30 0 - 5 WBC/hpf   Bacteria, UA RARE (A)  NONE SEEN   Squamous Epithelial / LPF NONE SEEN NONE SEEN   Mucous PRESENT    Hyaline Casts, UA PRESENT    Ct Abdomen Pelvis W Contrast  Result Date: 10/22/2016 CLINICAL DATA:  61 year old female with right lower quadrant pain for 2 days. Initial encounter. EXAM: CT ABDOMEN AND PELVIS WITH CONTRAST TECHNIQUE: Multidetector CT imaging of the abdomen and pelvis was performed using the standard protocol following bolus administration of intravenous contrast. CONTRAST:  100 cc Isovue 300. COMPARISON:  None.  FINDINGS: Lower chest: Basilar subsegmental atelectasis. Heart size within normal limits. Limited evaluation of breast parenchyma. Hepatobiliary: Enlarged liver spanning over are 20.8 cm. Multiple liver lesions are noted. Two liver cysts, largest measures up to 4.8 cm. Six liver lesions with characteristics suggest possibility of hemangiomas measuring up to 5.4 cm. Inferior right lobe liver 1.5 cm lesion probably a hemangioma however, elective MR of the liver recommended to confirm this is a hemangioma at which time, attention to the left lateral aspect of the left lobe of the liver recommended as there is a nodular appearance and underlying 3.6 cm mass cannot be excluded. No calcified gallstone. Pancreas: Top-normal size common bile duct and pancreatic duct without pancreatic mass noted. Spleen: No splenic mass or enlargement. Adrenals/Urinary Tract: No renal or ureteral obstructing stone or hydronephrosis. Tiny low-density renal structures may be cysts but too small to characterize. No adrenal lesion. Noncontrast filled views of the urinary bladder unremarkable. Stomach/Bowel: Diffuse inflammatory process involving the appendix, cecum and terminal ileum. My suspicion is that this is related to appendicitis with secondary involvement of the cecum and terminal ileum. Per discussion with Dr. Wyvonnia Dusky, patient is presenting with symptoms suspicious for appendicitis. A less likely consideration is inflammatory  process such as Crohn's disease of the terminal ileum and cecum with secondary inflammation of the appendix. Portions of stomach under distended. Evaluation of sigmoid colon limited by free fluid in the pelvis. Vascular/Lymphatic: No aortic aneurysm or large vessel occlusion. Ectatic celiac artery. Top-normal size lymph nodes porta hepatis/celiac axis. Reproductive: Retroverted uterus. Ovarian cysts larger on the right. Given the patient's age, pelvic sonogram on elective basis recommended to evaluate ovarian cystic structures. Prominent gonadal vessels greater on the left. This can be seen with pelvic venous engorgement. Other: Free fluid in the pelvis may be related to the right lower quadrant inflammatory process without free air. Post hernia repair left inguinal region. Musculoskeletal: Right iliac wing nonspecific lung lesion measuring up to 3.5 cm. IMPRESSION: Diffuse inflammatory process involving the appendix, cecum and terminal ileum. My suspicion is that this is related to appendicitis with secondary involvement of the cecum and terminal ileum. A less likely consideration is inflammatory process such as Crohn's disease of the terminal ileum and cecum with secondary inflammation of the appendix. Free fluid in the pelvis may be related to the right lower quadrant inflammatory process. Enlarged liver with multiple liver lesions including 2 cysts and 6 hemangiomas. Inferior right lobe of liver 1.5 cm lesion probably a hemangioma however, elective MR of the liver recommended to confirm this is a hemangioma at which time, attention to the left lateral aspect of the left lobe of the liver recommended as there is a nodular appearance and underlying 3.6 cm mass cannot be excluded. Ovarian cysts larger on the right. Given the patient's age, pelvic sonogram on elective basis recommended to evaluate ovarian cystic structures. Prominent gonadal vessels greater on the left. This can be seen with pelvic venous  engorgement. Right iliac wing nonspecific lung lesion measuring up to 3.5 cm. Post hernia repair left inguinal region. These results were called by telephone at the time of interpretation on 10/22/2016 at 6:53 am to Dr. Ezequiel Essex , who verbally acknowledged these results. Electronically Signed   By: Genia Del M.D.   On: 10/22/2016 07:24   Assessment/Plan Acute Appendicitis  - NPO, IVF - pain control - IV abx  - laparoscopic appendectomy today, Dr. Hassell Done   Liver enlargement w/ 2 cysts and 6 hemangiomas - outpatient follow  up/MRI at discharge recommended  Ovarian cysts R>L - outpatient follow up with OB/Gyn (above incidental findings discussed with patient)   Jill Alexanders, [facility] 10/22/2016, 7:37 AM Pager: [phone] Consults: [phone] Mon-Fri 7:00 am-4:30 pm Sat-Sun 7:00 am-11:30 am  Agree with above. She is down here from [place] with her twin daughters.  Nance Pew, one of the daughters is in the room.  She has had 2 colonoscopies - the last about 4 years ago. No chronic GI symptoms.  She has had a laparoscopy for endometriosis.  CT scan suggests appendicitis - several other findings including liver lesions (probably benign), ovarian cysts, and right iliac lesion - will give copy of CT to patient.  I discussed with the patient the indications and risks of appendiceal surgery.  The primary risks of appendiceal surgery include, but are not limited to, bleeding, infection, bowel surgery, and open surgery.  There is also the risk that the patient may have continued symptoms after surgery.  We discussed the typical post-operative recovery course. I tried to answer the patient's questions.  She is divorced. She is supposed to be down here till the weekend - for the swim tournament.  Alphonsa Overall, MD, [facility] Pager: [phone] Office phone:  [phone]

## 2016-10-22 NOTE — ED Notes (Signed)
Bed: WA14 Expected date:  Expected time:  Means of arrival:  Comments: RES B 

## 2016-10-22 NOTE — Transfer of Care (Signed)
Immediate Anesthesia Transfer of Care Note  Patient: Gabrielle Price  Procedure(s) Performed: Procedure(s): APPENDECTOMY LAPAROSCOPIC (N/A)  Patient Location: PACU  Anesthesia Type:General  Level of Consciousness: awake, alert , oriented and patient cooperative  Airway & Oxygen Therapy: Patient Spontanous Breathing and Patient connected to face mask oxygen  Post-op Assessment: Report given to RN, Post -op Vital signs reviewed and stable and Patient moving all extremities X 4  Post vital signs: stable  Last Vitals:  Vitals:   10/22/16 1143 10/22/16 1356  BP: (!) 104/55 114/63  Pulse: 96   Resp: 18   Temp: 37.6 C (!) 38.7 C    Last Pain:  Vitals:   10/22/16 1143  TempSrc: Oral  PainSc:          Complications: No apparent anesthesia complications

## 2016-10-23 ENCOUNTER — Encounter (HOSPITAL_COMMUNITY): Payer: Self-pay | Admitting: Surgery

## 2016-10-23 DIAGNOSIS — I959 Hypotension, unspecified: Secondary | ICD-10-CM | POA: Diagnosis present

## 2016-10-23 DIAGNOSIS — D1809 Hemangioma of other sites: Secondary | ICD-10-CM | POA: Diagnosis present

## 2016-10-23 DIAGNOSIS — N83202 Unspecified ovarian cyst, left side: Secondary | ICD-10-CM | POA: Diagnosis present

## 2016-10-23 DIAGNOSIS — N83201 Unspecified ovarian cyst, right side: Secondary | ICD-10-CM | POA: Diagnosis present

## 2016-10-23 DIAGNOSIS — J9811 Atelectasis: Secondary | ICD-10-CM | POA: Diagnosis not present

## 2016-10-23 DIAGNOSIS — Z8249 Family history of ischemic heart disease and other diseases of the circulatory system: Secondary | ICD-10-CM | POA: Diagnosis not present

## 2016-10-23 DIAGNOSIS — Z882 Allergy status to sulfonamides status: Secondary | ICD-10-CM | POA: Diagnosis not present

## 2016-10-23 DIAGNOSIS — R1031 Right lower quadrant pain: Secondary | ICD-10-CM | POA: Diagnosis present

## 2016-10-23 DIAGNOSIS — K352 Acute appendicitis with generalized peritonitis: Secondary | ICD-10-CM | POA: Diagnosis present

## 2016-10-23 DIAGNOSIS — K5900 Constipation, unspecified: Secondary | ICD-10-CM | POA: Diagnosis present

## 2016-10-23 LAB — BASIC METABOLIC PANEL
Anion gap: 5 (ref 5–15)
BUN: 9 mg/dL (ref 6–20)
CALCIUM: 8.2 mg/dL — AB (ref 8.9–10.3)
CHLORIDE: 102 mmol/L (ref 101–111)
CO2: 29 mmol/L (ref 22–32)
CREATININE: 0.73 mg/dL (ref 0.44–1.00)
GFR calc Af Amer: >60 mL/min (ref >60)
GFR calc non Af Amer: >60 mL/min (ref >60)
Glucose, Bld: 118 mg/dL — ABNORMAL HIGH (ref 65–99)
Potassium: 3.8 mmol/L (ref 3.5–5.1)
SODIUM: 136 mmol/L (ref 135–145)

## 2016-10-23 LAB — CBC
HCT: 29.2 % — ABNORMAL LOW (ref 36.0–46.0)
Hemoglobin: 9.8 g/dL — ABNORMAL LOW (ref 12.0–15.0)
MCH: 29.6 pg (ref 26.0–34.0)
MCHC: 33.6 g/dL (ref 30.0–36.0)
MCV: 88.2 fL (ref 78.0–100.0)
PLATELETS: 133 10*3/uL — AB (ref 150–400)
RBC: 3.31 MIL/uL — ABNORMAL LOW (ref 3.87–5.11)
RDW: 13.4 % (ref 11.5–15.5)
WBC: 5.6 10*3/uL (ref 4.0–10.5)

## 2016-10-23 LAB — HIV ANTIBODY (ROUTINE TESTING W REFLEX): HIV SCREEN 4TH GENERATION: NONREACTIVE

## 2016-10-23 MED ORDER — ACETAMINOPHEN 325 MG PO TABS
650.0000 mg | ORAL_TABLET | ORAL | Status: DC | PRN
  Administered 2016-10-23 – 2016-10-26 (×4): 650 mg via ORAL
  Filled 2016-10-23 (×4): qty 2

## 2016-10-23 NOTE — Care Management Note (Signed)
Case Management Note  Patient Details  Name: Gabrielle Price MRN: [id number] Date of Birth: Feb 28, 1956  Subjective/Objective:  61 y/o f admitted w/Acute appendicitis, s/p Lap appy. From home.  Temp, fulls, ivf,iv abx.                Action/Plan:d/c home.   Expected Discharge Date:               Expected Discharge Plan:  Home/Self Care  In-House Referral:     Discharge planning Services  CM Consult  Post Acute Care Choice:    Choice offered to:     DME Arranged:    DME Agency:     HH Arranged:    HH Agency:     Status of Service:  In process, will continue to follow  If discussed at Long Length of Stay Meetings, dates discussed:    Additional Comments:  Dessa Phi, RN 10/23/2016, 12:30 PM

## 2016-10-23 NOTE — Progress Notes (Signed)
[facility] Progress Note  1 Day Post-Op  Subjective: Cc abdominal and right lower back pain. Tolerating clear liquids without nausea or vomiting. +flatus. Denies BM.   Objective: Vital signs in last 24 hours: Temp:  [97.7 F (36.5 C)-101.6 F (38.7 C)] 99.2 F (37.3 C) (04/04 0523) Pulse Rate:  [70-97] 70 (04/04 0542) Resp:  [17-23] 18 (04/04 0523) BP: (86-114)/(46-76) 93/55 (04/04 0542) SpO2:  [95 %-100 %] 97 % (04/04 0523) Last BM Date: 10/20/16  Intake/Output from previous day: 04/03 0701 - 04/04 0700 In: 4276.7 [P.O.:360; I.V.:3816.7; IV Piggyback:100] Out: 1700 [Urine:1650; Blood:50] Intake/Output this shift: Total I/O In: 240 [P.O.:240] Out: 900 [Urine:900]  PE: Gen:  Alert, NAD, pleasant Pulm:  Non-labored, clear to auscultation bilaterally Abd: Soft, appropriately tender, incisions c/d/I, hypoactive BS  Ext:  No erythema, edema, or tenderness   Lab Results:   Recent Labs  10/22/16 0435 10/23/16 0453  WBC 10.2 5.6  HGB 13.1 9.8*  HCT 38.3 29.2*  PLT 208 133*   BMET  Recent Labs  10/22/16 0435 10/23/16 0453  NA 139 136  K 4.2 3.8  CL 102 102  CO2 29 29  GLUCOSE 140* 118*  BUN 11 9  CREATININE 0.77 0.73  CALCIUM 9.5 8.2*   PT/INR No results for input(s): LABPROT, INR in the last 72 hours. CMP     Component Value Date/Time   NA 136 10/23/2016 0453   K 3.8 10/23/2016 0453   CL 102 10/23/2016 0453   CO2 29 10/23/2016 0453   GLUCOSE 118 (H) 10/23/2016 0453   BUN 9 10/23/2016 0453   CREATININE 0.73 10/23/2016 0453   CALCIUM 8.2 (L) 10/23/2016 0453   PROT 7.2 10/22/2016 0435   ALBUMIN 4.4 10/22/2016 0435   AST 18 10/22/2016 0435   ALT 16 10/22/2016 0435   ALKPHOS 57 10/22/2016 0435   BILITOT 2.0 (H) 10/22/2016 0435   GFRNONAA >60 10/23/2016 0453   GFRAA >60 10/23/2016 0453   Lipase     Component Value Date/Time   LIPASE 16 10/22/2016 0435       Studies/Results: Ct Abdomen Pelvis W Contrast  Result Date:  10/22/2016 CLINICAL DATA:  61 year old female with right lower quadrant pain for 2 days. Initial encounter. EXAM: CT ABDOMEN AND PELVIS WITH CONTRAST TECHNIQUE: Multidetector CT imaging of the abdomen and pelvis was performed using the standard protocol following bolus administration of intravenous contrast. CONTRAST:  100 cc Isovue 300. COMPARISON:  None. FINDINGS: Lower chest: Basilar subsegmental atelectasis. Heart size within normal limits. Limited evaluation of breast parenchyma. Hepatobiliary: Enlarged liver spanning over are 20.8 cm. Multiple liver lesions are noted. Two liver cysts, largest measures up to 4.8 cm. Six liver lesions with characteristics suggest possibility of hemangiomas measuring up to 5.4 cm. Inferior right lobe liver 1.5 cm lesion probably a hemangioma however, elective MR of the liver recommended to confirm this is a hemangioma at which time, attention to the left lateral aspect of the left lobe of the liver recommended as there is a nodular appearance and underlying 3.6 cm mass cannot be excluded. No calcified gallstone. Pancreas: Top-normal size common bile duct and pancreatic duct without pancreatic mass noted. Spleen: No splenic mass or enlargement. Adrenals/Urinary Tract: No renal or ureteral obstructing stone or hydronephrosis. Tiny low-density renal structures may be cysts but too small to characterize. No adrenal lesion. Noncontrast filled views of the urinary bladder unremarkable. Stomach/Bowel: Diffuse inflammatory process involving the appendix, cecum and terminal ileum. My suspicion is that this is  related to appendicitis with secondary involvement of the cecum and terminal ileum. Per discussion with Dr. Wyvonnia Dusky, patient is presenting with symptoms suspicious for appendicitis. A less likely consideration is inflammatory process such as Crohn's disease of the terminal ileum and cecum with secondary inflammation of the appendix. Portions of stomach under distended. Evaluation of  sigmoid colon limited by free fluid in the pelvis. Vascular/Lymphatic: No aortic aneurysm or large vessel occlusion. Ectatic celiac artery. Top-normal size lymph nodes porta hepatis/celiac axis. Reproductive: Retroverted uterus. Ovarian cysts larger on the right. Given the patient's age, pelvic sonogram on elective basis recommended to evaluate ovarian cystic structures. Prominent gonadal vessels greater on the left. This can be seen with pelvic venous engorgement. Other: Free fluid in the pelvis may be related to the right lower quadrant inflammatory process without free air. Post hernia repair left inguinal region. Musculoskeletal: Right iliac wing nonspecific lung lesion measuring up to 3.5 cm. IMPRESSION: Diffuse inflammatory process involving the appendix, cecum and terminal ileum. My suspicion is that this is related to appendicitis with secondary involvement of the cecum and terminal ileum. A less likely consideration is inflammatory process such as Crohn's disease of the terminal ileum and cecum with secondary inflammation of the appendix. Free fluid in the pelvis may be related to the right lower quadrant inflammatory process. Enlarged liver with multiple liver lesions including 2 cysts and 6 hemangiomas. Inferior right lobe of liver 1.5 cm lesion probably a hemangioma however, elective MR of the liver recommended to confirm this is a hemangioma at which time, attention to the left lateral aspect of the left lobe of the liver recommended as there is a nodular appearance and underlying 3.6 cm mass cannot be excluded. Ovarian cysts larger on the right. Given the patient's age, pelvic sonogram on elective basis recommended to evaluate ovarian cystic structures. Prominent gonadal vessels greater on the left. This can be seen with pelvic venous engorgement. Right iliac wing nonspecific lung lesion measuring up to 3.5 cm. Post hernia repair left inguinal region. These results were called by telephone at the time  of interpretation on 10/22/2016 at 6:53 am to Dr. Ezequiel Essex , who verbally acknowledged these results. Electronically Signed   By: Genia Del M.D.   On: 10/22/2016 07:24    Anti-infectives: Anti-infectives    Start     Dose/Rate Route Frequency Ordered Stop   10/23/16 0600  cefTRIAXone (ROCEPHIN) 2 g in dextrose 5 % 50 mL IVPB  Status:  Discontinued     2 g 100 mL/hr over 30 Minutes Intravenous Every 24 hours 10/22/16 0906 10/22/16 1459   10/22/16 1800  piperacillin-tazobactam (ZOSYN) IVPB 3.375 g     3.375 g 12.5 mL/hr over 240 Minutes Intravenous Every 8 hours 10/22/16 1459     10/22/16 1600  metroNIDAZOLE (FLAGYL) IVPB 500 mg  Status:  Discontinued     500 mg 100 mL/hr over 60 Minutes Intravenous Every 8 hours 10/22/16 0906 10/22/16 1459   10/22/16 1209  metroNIDAZOLE (FLAGYL) 5-0.79 MG/ML-% IVPB    Comments:  Domenic Moras   : cabinet override      10/22/16 1209 10/23/16 0014   10/22/16 0700  cefTRIAXone (ROCEPHIN) 2 g in dextrose 5 % 50 mL IVPB     2 g 100 mL/hr over 30 Minutes Intravenous  Once 10/22/16 0654 10/22/16 0738   10/22/16 0700  metroNIDAZOLE (FLAGYL) IVPB 500 mg     500 mg 100 mL/hr over 60 Minutes Intravenous  Once 10/22/16 0654 10/22/16 0844  Assessment/Plan Acute appendicitis with perforation S/P laparoscopic appendectomy 10/22/16 Dr. Alphonsa Overall  - fever noted, monitor  - continue IV abx  - mobilize as tolerated   Hypotension - appears to run low at baseline. Not tachycardic. Not symptomatic. 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 outpatient follow up/MRI at discharge recommended  Ovarian cysts R>L - outpatient follow up with OB/Gyn Right iliac lesion - outpatient follow up   FEN: clear liquid diet, IVF  ID: IV Zosyn 4/3 >> VTE: SCD's, re-check CBC prior to initiation chemical VTE prophylaxis   Plan:continue IV abx, CBC in AM     LOS: 0 days     Jill Alexanders , [facility] 10/23/2016, 10:30 AM Pager: [phone] Consults: [phone] Mon-Fri 7:00 am-4:30 pm Sat-Sun 7:00 am-11:30 am

## 2016-10-24 LAB — CBC
HCT: 28.8 % — ABNORMAL LOW (ref 36.0–46.0)
HEMOGLOBIN: 9.7 g/dL — AB (ref 12.0–15.0)
MCH: 29.8 pg (ref 26.0–34.0)
MCHC: 33.7 g/dL (ref 30.0–36.0)
MCV: 88.6 fL (ref 78.0–100.0)
Platelets: 128 10*3/uL — ABNORMAL LOW (ref 150–400)
RBC: 3.25 MIL/uL — ABNORMAL LOW (ref 3.87–5.11)
RDW: 13.1 % (ref 11.5–15.5)
WBC: 5.5 10*3/uL (ref 4.0–10.5)

## 2016-10-24 MED ORDER — DOCUSATE SODIUM 100 MG PO CAPS: 100.0000 mg | ORAL_CAPSULE | Freq: Every day | ORAL | Status: DC

## 2016-10-24 MED ORDER — BISACODYL 10 MG RE SUPP
10.0000 mg | Freq: Once | RECTAL | Status: AC
  Administered 2016-10-24: 10 mg via RECTAL
  Filled 2016-10-24: qty 1

## 2016-10-24 MED ORDER — DOCUSATE SODIUM 100 MG PO CAPS: 100.0000 mg | ORAL_CAPSULE | Freq: Every day | ORAL | Status: DC | PRN

## 2016-10-24 MED ORDER — MAGNESIUM HYDROXIDE 400 MG/5ML PO SUSP
60.0000 mL | Freq: Once | ORAL | Status: AC
  Administered 2016-10-24: 60 mL via ORAL
  Filled 2016-10-24 (×2): qty 60

## 2016-10-24 MED ORDER — SACCHAROMYCES BOULARDII 250 MG PO CAPS
250.0000 mg | ORAL_CAPSULE | Freq: Two times a day (BID) | ORAL | Status: DC
  Administered 2016-10-24 – 2016-10-27 (×7): 250 mg via ORAL
  Filled 2016-10-24 (×7): qty 1

## 2016-10-24 MED ORDER — IBUPROFEN 200 MG PO TABS: 600.0000 mg | ORAL_TABLET | Freq: Four times a day (QID) | ORAL | Status: DC | PRN

## 2016-10-24 MED ORDER — BISACODYL 10 MG RE SUPP: 10.0000 mg | Freq: Once | RECTAL | Status: DC

## 2016-10-24 NOTE — Progress Notes (Signed)
Pt had fever of 101.3. Encouraged pt to use her IS, administered 2 Tylenol.  At 2231, temp recheck was 98.6. Will continue to monitor.

## 2016-10-24 NOTE — Progress Notes (Signed)
Pt requested colace to help her have a BM. She reported that Dr. Hassell Done said she could have one, but there was not order. Pt reports no stool since Monday, and feels bloated. Her abd is slightly distended but soft.  Notified provider on call, obtained order for Dulcolax, Colace, and milk of magnesia. Administered MOM and dulcolax, will hold off on colace for now. Dulcolax suppository order can be repeated x 1 if not a good result from the first one.

## 2016-10-24 NOTE — Progress Notes (Signed)
2 Days Post-Op  Subjective: CC:  Acute perforated appendicitis with pus in pelvis.  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  Right ovary with benign appearing cyst.  She looks pretty good this a.m. She's walking the halls. She has a fair amount of tenderness with getting up and getting down. On exam she does not have peritonitis. Her port sites all look good.    Objective: Vital signs in last 24 hours: Temp:  [98.9 F (37.2 C)-102.2 F (39 C)] 99.6 F (37.6 C) (04/05 0615) Pulse Rate:  [70-88] 72 (04/05 0615) Resp:  [18-24] 18 (04/05 0615) BP: (94-117)/(52-62) 94/52 (04/05 0615) SpO2:  [96 %-100 %] 96 % (04/05 0615) Last BM Date: 10/23/16 PO 2217 IV 2400] Urine 6200 No Bm recorded MAXIMUM TEMPERATURE 102.2 yesterday at 1500; afebrile since that time. Blood pressure, 122 to mid 78E systolic. WBC is normal hemoglobin and hematocrit are stable. Platelets 128,000. No films. Intake/Output from previous day: 04/04 0701 - 04/05 0700 In: 4703.7 [P.O.:2217; I.V.:2386.7; IV Piggyback:100] Out: 6200 [Urine:6200] Intake/Output this shift: No intake/output data recorded.  General appearance: alert, cooperative and no distress Resp: clear to auscultation bilaterally GI: Abdomen sore, no distention, positive bowel sounds. Tolerating diet no BM so far. She does not have peritonitis.  Lab Results:   Recent Labs  10/23/16 0453 10/24/16 0436  WBC 5.6 5.5  HGB 9.8* 9.7*  HCT 29.2* 28.8*  PLT 133* 128*    BMET  Recent Labs  10/22/16 0435 10/23/16 0453  NA 139 136  K 4.2 3.8  CL 102 102  CO2 29 29  GLUCOSE 140* 118*  BUN 11 9  CREATININE 0.77 0.73  CALCIUM 9.5 8.2*   PT/INR No results for input(s): LABPROT, INR in the last 72 hours.   Recent Labs Lab 10/22/16 0435  AST 18  ALT 16  ALKPHOS 57  BILITOT 2.0*  PROT 7.2  ALBUMIN 4.4     Lipase     Component Value Date/Time   LIPASE 16 10/22/2016 0435     Studies/Results: No results found. Prior to Admission  medications   Medication Sig Start Date End Date Taking? Authorizing Provider  calcium-vitamin D (OSCAL WITH D) 500-200 MG-UNIT tablet Take 1 tablet by mouth daily with breakfast.   Yes Historical Provider, MD  clonazePAM (KLONOPIN) 0.5 MG tablet Take 0.25 mg by mouth at bedtime as needed (sleep).   Yes Historical Provider, MD  Multiple Vitamin (MULTIVITAMIN WITH MINERALS) TABS tablet Take 1 tablet by mouth daily.   Yes Historical Provider, MD    Medications: . mouth rinse  15 mL Mouth Rinse BID  . piperacillin-tazobactam  3.375 g Intravenous Q8H  . scopolamine  1 patch Transdermal Q72H   . dextrose 5 % and 0.45 % NaCl with KCl 20 mEq/L 100 mL/hr at 10/23/16 2259     Assessment/Plan Acute perforated appendicitis with pus in pelvis.  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  Right ovary with benign appearing cyst. s/p Laparoscopic appendectomy, 10/22/16, Dr. Alphonsa Overall FEN: IV fluids/full liquids ID: Ceftriaxone 1 dose 10/22/16;  Flagyl 1 g 10/22/16; Zosyn 4/3 =>> day 3 DVT:  SCDs    Plan: Continue her on the IV Zosyn, decrease antibiotics, soft diet. Continue to follow labs closely and consider transitioning to oral antibiotics tomorrow. Add probiotic. Holding off on heparin/Lovenox secondary to decreased platelet count.     LOS: 1 day    Gabrielle Price 10/24/2016 [phone]

## 2016-10-25 ENCOUNTER — Inpatient Hospital Stay (HOSPITAL_COMMUNITY): Payer: BLUE CROSS/BLUE SHIELD

## 2016-10-25 MED ORDER — BISACODYL 10 MG RE SUPP: 10.0000 mg | Freq: Every day | RECTAL | Status: DC | PRN

## 2016-10-25 MED ORDER — IOPAMIDOL (ISOVUE-300) INJECTION 61%
INTRAVENOUS | Status: AC
  Filled 2016-10-25: qty 30

## 2016-10-25 MED ORDER — IOPAMIDOL (ISOVUE-300) INJECTION 61%
30.0000 mL | Freq: Once | INTRAVENOUS | Status: AC | PRN
  Administered 2016-10-25: 30 mL via ORAL

## 2016-10-25 MED ORDER — IOPAMIDOL (ISOVUE-300) INJECTION 61%
INTRAVENOUS | Status: AC
  Administered 2016-10-25: 100 mL
  Filled 2016-10-25: qty 100

## 2016-10-25 NOTE — Discharge Instructions (Signed)
SCHEDULE AN APPOINTMENT FOR FOLLOW UP WITH YOUR PRIMARY CARE PROVIDER OR A GENERAL SURGEON IN [place] IN 2 WEEKS TO HAVE YOUR INCISIONS CHECKED.  FOLLOW UP WITH YOUR PRIMARY CARE DOCTOR FOR FURTHER EVALUATION OF THE CYSTS NOTED ON YOUR LIVER ON CT SCAN.  FOLLOW UP WITH YOUR OB/GYN REGARDING OVARIAN CYSTS.  LAPAROSCOPIC SURGERY: POST OP INSTRUCTIONS  1. DIET: Follow a light bland diet the first 24 hours after arrival home, such as soup, liquids, crackers, etc. Be sure to include lots of fluids daily. Avoid fast food or heavy meals as your are more likely to get nauseated. Eat a low fat the next few days after surgery.  2. Take your usually prescribed home medications unless otherwise directed. 3. PAIN CONTROL:  1. Pain is best controlled by a usual combination of three different methods TOGETHER:  1. Ice/Heat 2. Over the counter pain medication 3. Prescription pain medication 2. Most patients will experience some swelling and bruising around the incisions. Ice packs or heating pads (30-60 minutes up to 6 times a day) will help. Use ice for the first few days to help decrease swelling and bruising, then switch to heat to help relax tight/sore spots and speed recovery. Some people prefer to use ice alone, heat alone, alternating between ice & heat. Experiment to what works for you. Swelling and bruising can take several weeks to resolve.  3. It is helpful to take an over-the-counter pain medication regularly for the first few weeks. Choose one of the following that works best for you:  1. Naproxen (Aleve, etc) Two 220mg  tabs twice a day 2. Ibuprofen (Advil, etc) Three 200mg  tabs four times a day (every meal & bedtime) 3. Acetaminophen (Tylenol, etc) 500-650mg  four times a day (every meal & bedtime) 4. A prescription for pain medication (such as oxycodone, hydrocodone, etc) should be given to you upon discharge. Take your pain medication as prescribed.  1. If you are having problems/concerns with the  prescription medicine (does not control pain, nausea, vomiting, rash, itching, etc), please call us [phone] to see if we need to switch you to a different pain medicine that will work better for you and/or control your side effect better. 2. If you need a refill on your pain medication, please contact your pharmacy. They will contact our office to request authorization. Prescriptions will not be filled after 5 pm or on week-ends. 4. Avoid getting constipated. Between the surgery and the pain medications, it is common to experience some constipation. Increasing fluid intake and taking a fiber supplement (such as Metamucil, Citrucel, FiberCon, MiraLax, etc) 1-2 times a day regularly will usually help prevent this problem from occurring. A mild laxative (prune juice, Milk of Magnesia, MiraLax, etc) should be taken according to package directions if there are no bowel movements after 48 hours.  5. Watch out for diarrhea. If you have many loose bowel movements, simplify your diet to bland foods & liquids for a few days. Stop any stool softeners and decrease your fiber supplement. Switching to mild anti-diarrheal medications (Kayopectate, Pepto Bismol) can help. If this worsens or does not improve, please call us. 6. Wash / shower every day. You may shower over the dressings as they are waterproof. Continue to shower over incision(s) after the dressing is off. 7. Remove your waterproof bandages 5 days after surgery. You may leave the incision open to air. You may replace a dressing/Band-Aid to cover the incision for comfort if you wish.  8. ACTIVITIES as tolerated:  1. You may resume regular (light) daily activities beginning the next day--such as daily self-care, walking, climbing stairs--gradually increasing activities as tolerated. If you can walk 30 minutes without difficulty, it is safe to try more intense activity such as jogging, treadmill, bicycling, low-impact aerobics, swimming, etc. 2. Save the  most intensive and strenuous activity for last such as sit-ups, heavy lifting, contact sports, etc Refrain from any heavy lifting or straining until you are off narcotics for pain control.  3. DO NOT PUSH THROUGH PAIN. Let pain be your guide: If it hurts to do something, don't do it. Pain is your body warning you to avoid that activity for another week until the pain goes down. 4. You may drive when you are no longer taking prescription pain medication, you can comfortably wear a seatbelt, and you can safely maneuver your car and apply brakes. 5. You may have sexual intercourse when it is comfortable.  9. FOLLOW UP in our office  1. Please call [facility] at [phone] to set up an appointment to see your surgeon in the office for a follow-up appointment approximately 2-3 weeks after your surgery. 2. Make sure that you call for this appointment the day you arrive home to insure a convenient appointment time.      10. IF YOU HAVE DISABILITY OR FAMILY LEAVE FORMS, BRING THEM TO THE               OFFICE FOR PROCESSING.   WHEN TO CALL us [phone]:  1. Poor pain control 2. Reactions / problems with new medications (rash/itching, nausea, etc)  3. Fever over 101.5 F (38.5 C) 4. Inability to urinate 5. Nausea and/or vomiting 6. Worsening swelling or bruising 7. Continued bleeding from incision. 8. Increased pain, redness, or drainage from the incision  The clinic staff is available to answer your questions during regular business hours (8:30am-5pm). Please dont hesitate to call and ask to speak to one of our nurses for clinical concerns.  If you have a medical emergency, go to the nearest emergency room or call 911.  A surgeon from [facility] is always on call at the [facility], [address] [place], [place], [place], [place] [address] ?  MAIN: [phone] ? TOLL FREE: [phone] ?  FAX [phone]  [email or web address]

## 2016-10-25 NOTE — Progress Notes (Signed)
[facility] Progress Note  3 Days Post-Op  Subjective: CC right, lower back pain and fever. Had a small BM after dulcolax however still feels bloated. Tolerating PO without N/V. Abdominal pain controlled without the use of narcotic medication. Pulling ~1,000cc on IS. Denies cough, SOB.   Objective: Vital signs in last 24 hours: Temp:  [98.6 F (37 C)-101.3 F (38.5 C)] 99.3 F (37.4 C) (04/06 0626) Pulse Rate:  [96-97] 97 (04/06 0626) Resp:  [18] 18 (04/06 0626) BP: (97-131)/(64-73) 114/64 (04/06 0626) SpO2:  [100 %] 100 % (04/06 0626) Last BM Date: 10/21/16  Intake/Output from previous day: 04/05 0701 - 04/06 0700 In: 2811.7 [P.O.:1320; I.V.:1291.7; IV Piggyback:200] Out: 4700 [Urine:4700] Intake/Output this shift: No intake/output data recorded.  PE: Gen:  Alert, NAD, pleasant Pulm:  normal respiratory effort, clear to auscultation bilaterally Abd: Soft, appropriately tender, incisions c/d/I, BS present in all 4 quadrants, no peritonitis  Ext:  No erythema, edema, or tenderness    Lab Results:   Recent Labs  10/23/16 0453 10/24/16 0436  WBC 5.6 5.5  HGB 9.8* 9.7*  HCT 29.2* 28.8*  PLT 133* 128*   BMET  Recent Labs  10/23/16 0453  NA 136  K 3.8  CL 102  CO2 29  GLUCOSE 118*  BUN 9  CREATININE 0.73  CALCIUM 8.2*   PT/INR No results for input(s): LABPROT, INR in the last 72 hours. CMP     Component Value Date/Time   NA 136 10/23/2016 0453   K 3.8 10/23/2016 0453   CL 102 10/23/2016 0453   CO2 29 10/23/2016 0453   GLUCOSE 118 (H) 10/23/2016 0453   BUN 9 10/23/2016 0453   CREATININE 0.73 10/23/2016 0453   CALCIUM 8.2 (L) 10/23/2016 0453   PROT 7.2 10/22/2016 0435   ALBUMIN 4.4 10/22/2016 0435   AST 18 10/22/2016 0435   ALT 16 10/22/2016 0435   ALKPHOS 57 10/22/2016 0435   BILITOT 2.0 (H) 10/22/2016 0435   GFRNONAA >60 10/23/2016 0453   GFRAA >60 10/23/2016 0453   Lipase     Component Value Date/Time   LIPASE 16 10/22/2016  0435       Studies/Results: No results found.  Anti-infectives: Anti-infectives    Start     Dose/Rate Route Frequency Ordered Stop   10/23/16 0600  cefTRIAXone (ROCEPHIN) 2 g in dextrose 5 % 50 mL IVPB  Status:  Discontinued     2 g 100 mL/hr over 30 Minutes Intravenous Every 24 hours 10/22/16 0906 10/22/16 1459   10/22/16 1800  piperacillin-tazobactam (ZOSYN) IVPB 3.375 g     3.375 g 12.5 mL/hr over 240 Minutes Intravenous Every 8 hours 10/22/16 1459     10/22/16 1600  metroNIDAZOLE (FLAGYL) IVPB 500 mg  Status:  Discontinued     500 mg 100 mL/hr over 60 Minutes Intravenous Every 8 hours 10/22/16 0906 10/22/16 1459   10/22/16 1209  metroNIDAZOLE (FLAGYL) 5-0.79 MG/ML-% IVPB    Comments:  Domenic Moras   : cabinet override      10/22/16 1209 10/23/16 0014   10/22/16 0700  cefTRIAXone (ROCEPHIN) 2 g in dextrose 5 % 50 mL IVPB     2 g 100 mL/hr over 30 Minutes Intravenous  Once 10/22/16 0654 10/22/16 0738   10/22/16 0700  metroNIDAZOLE (FLAGYL) IVPB 500 mg     500 mg 100 mL/hr over 60 Minutes Intravenous  Once 10/22/16 0654 10/22/16 0844     Assessment/Plan Acute appendicitis with perforation S/P laparoscopic appendectomy 10/22/16  Dr. Alphonsa Overall  - fever noted, monitor  - continue IV abx  - mobilize as tolerated   Hypotension - appears to run low at baseline. Not tachycardic. Not symptomatic. 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 outpatient follow up/MRI at discharge recommended  Ovarian cysts R>L - outpatient follow up with OB/Gyn Right iliac lesion - outpatient follow up   FEN: SOFT diet, IVF  ID: IV Zosyn 4/3 >> VTE: SCD's  Plan: continue IV abx, Tylenol for perisistent fever. CT abd/pelv W/ to r/o intraabdominal abscess.   LOS: 2 days    Jill Alexanders , [facility] 10/25/2016, 9:27 AM Pager: [phone] Consults:  [phone] Mon-Fri 7:00 am-4:30 pm Sat-Sun 7:00 am-11:30 am

## 2016-10-26 NOTE — Progress Notes (Signed)
4 Days Post-Op  Subjective: Pt doing well no fvers overnight  Objective: Vital signs in last 24 hours: Temp:  [98.1 F (36.7 C)-100.3 F (37.9 C)] 100.3 F (37.9 C) (04/07 0857) Pulse Rate:  [79-87] 79 (04/07 0500) Resp:  [18-20] 20 (04/07 0500) BP: (109-127)/(77-96) 127/79 (04/07 0500) SpO2:  [97 %-100 %] 97 % (04/07 0500) Last BM Date: 10/25/16  Intake/Output from previous day: 04/06 0701 - 04/07 0700 In: 1972 [P.O.:120; I.V.:1682; IV Piggyback:170] Out: -  Intake/Output this shift: No intake/output data recorded.  General appearance: alert and cooperative GI: soft, non-tender; bowel sounds normal; no masses,  no organomegaly  Lab Results:   Recent Labs  10/24/16 0436  WBC 5.5  HGB 9.7*  HCT 28.8*  PLT 128*   BMET No results for input(s): NA, K, CL, CO2, GLUCOSE, BUN, CREATININE, CALCIUM in the last 72 hours. PT/INR No results for input(s): LABPROT, INR in the last 72 hours. ABG No results for input(s): PHART, HCO3 in the last 72 hours.  Invalid input(s): PCO2, PO2  Studies/Results: Ct Abdomen Pelvis W Contrast  Result Date: 10/25/2016 CLINICAL DATA:  Right-sided abdominal pain and fever. Three days postop from appendectomy for acute appendicitis. EXAM: CT ABDOMEN AND PELVIS WITH CONTRAST TECHNIQUE: Multidetector CT imaging of the abdomen and pelvis was performed using the standard protocol following bolus administration of intravenous contrast. CONTRAST:  110mL ISOVUE-300 IOPAMIDOL (ISOVUE-300) INJECTION 61% COMPARISON:  10/22/2016 FINDINGS: Lower Chest: Tiny right pleural effusion and right basilar atelectasis has increased since previous study. Hepatobiliary: Multiple benign hepatic hemangiomas are again seen, largest in the anterior right lobe measuring 5.7 cm. Two cysts are also seen mediolateral segments of the left lobe, largest measuring 4.7 cm. Some layering gallbladder sludge is seen, however there is no evidence of acute cholecystitis or biliary ductal  dilatation. Pancreas:  No mass or inflammatory changes. Spleen: Within normal limits in size and appearance. Adrenals/Urinary Tract: No masses identified. No evidence of hydronephrosis. Unremarkable unopacified urinary bladder. Stomach/Bowel: Expected postoperative change from recent appendectomy. No evidence of abscess or bowel obstruction. Vascular/Lymphatic: No pathologically enlarged lymph nodes. No abdominal aortic aneurysm. Reproductive:  No mass or other significant abnormality. Other: Surgical mesh noted in the left inguinal region. No evidence of recurrent hernia. Musculoskeletal:  No suspicious bone lesions identified. IMPRESSION: Expected postoperative changes from recent appendectomy. No evidence of abscess, bowel obstruction, or other complication. Increased right basilar atelectasis and tiny right pleural effusion. Stable multiple benign hepatic hemangiomas and cysts. Electronically Signed   By: Earle Gell M.D.   On: 10/25/2016 14:55    Anti-infectives: Anti-infectives    Start     Dose/Rate Route Frequency Ordered Stop   10/23/16 0600  cefTRIAXone (ROCEPHIN) 2 g in dextrose 5 % 50 mL IVPB  Status:  Discontinued     2 g 100 mL/hr over 30 Minutes Intravenous Every 24 hours 10/22/16 0906 10/22/16 1459   10/22/16 1800  piperacillin-tazobactam (ZOSYN) IVPB 3.375 g     3.375 g 12.5 mL/hr over 240 Minutes Intravenous Every 8 hours 10/22/16 1459     10/22/16 1600  metroNIDAZOLE (FLAGYL) IVPB 500 mg  Status:  Discontinued     500 mg 100 mL/hr over 60 Minutes Intravenous Every 8 hours 10/22/16 0906 10/22/16 1459   10/22/16 1209  metroNIDAZOLE (FLAGYL) 5-0.79 MG/ML-% IVPB    Comments:  Domenic Moras   : cabinet override      10/22/16 1209 10/23/16 0014   10/22/16 0700  cefTRIAXone (ROCEPHIN) 2 g in dextrose  5 % 50 mL IVPB     2 g 100 mL/hr over 30 Minutes Intravenous  Once 10/22/16 0654 10/22/16 0738   10/22/16 0700  metroNIDAZOLE (FLAGYL) IVPB 500 mg     500 mg 100 mL/hr over 60  Minutes Intravenous  Once 10/22/16 0654 10/22/16 0844      Assessment/Plan: s/p Procedure(s): APPENDECTOMY LAPAROSCOPIC (N/A) Adv diet as tol to reg Mobilize IS hopefulyl home tomorrow  LOS: 3 days    Rosario Jacks., Anne Hahn 10/26/2016

## 2016-10-27 LAB — CBC WITH DIFFERENTIAL/PLATELET
BASOS PCT: 0 %
Basophils Absolute: 0 10*3/uL (ref 0.0–0.1)
EOS PCT: 5 %
Eosinophils Absolute: 0.3 10*3/uL (ref 0.0–0.7)
HEMATOCRIT: 31.1 % — AB (ref 36.0–46.0)
Hemoglobin: 10.5 g/dL — ABNORMAL LOW (ref 12.0–15.0)
LYMPHS PCT: 21 %
Lymphs Abs: 1.1 10*3/uL (ref 0.7–4.0)
MCH: 28.5 pg (ref 26.0–34.0)
MCHC: 33.8 g/dL (ref 30.0–36.0)
MCV: 84.3 fL (ref 78.0–100.0)
MONO ABS: 0.5 10*3/uL (ref 0.1–1.0)
MONOS PCT: 10 %
Neutro Abs: 3.3 10*3/uL (ref 1.7–7.7)
Neutrophils Relative %: 64 %
PLATELETS: 227 10*3/uL (ref 150–400)
RBC: 3.69 MIL/uL — ABNORMAL LOW (ref 3.87–5.11)
RDW: 12.8 % (ref 11.5–15.5)
WBC: 5.2 10*3/uL (ref 4.0–10.5)

## 2016-10-27 MED ORDER — AMOXICILLIN-POT CLAVULANATE 875-125 MG PO TABS: 1.0000 | ORAL_TABLET | Freq: Two times a day (BID) | ORAL | 0 refills | Status: AC

## 2016-10-27 MED ORDER — ALIGN 4 MG PO CAPS: 4.0000 mg | ORAL_CAPSULE | Freq: Every day | ORAL | 0 refills | Status: AC

## 2016-10-27 NOTE — Discharge Summary (Signed)
Physician Discharge Summary  Patient ID: Gabrielle Price MRN: [id number] DOB/AGE: 08-05-1955 61 y.o.  Admit date: 10/22/2016 Discharge date: 10/27/2016  Admission Diagnoses:Acute appendicitis  Discharge Diagnoses:  Active Problems:   Acute appendicitis Status post laparoscopic appendectomy  Discharged Condition: good  Hospital Course: Patient was admitted on 4 2018 and underwent laparoscopic appendectomy secondary to acute perforated appendicitis. Patient was subsequently sent for slowly started on a liquid diet and transitioned to a regular diet which she is able to tolerate well. Patient is continuing antibiotics secondary to perforation. Patient did have some fevers and postop period. Patient with CT scan which revealed no intra-abdominal abscesses. However there was signs of atelectasis. Patient had minimal respiratory reserve. 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 She is otherwise entering well. She had good bowel function. She is otherwise had normal WBC count was deemed stable for discharge and discharged home.   Consults: none  Significant Diagnostic Studies: radiology: CT scan: Revealed no intra-abdominal abscesses on postoperative day #3.  Treatments: antibiotics: Zosyn  Discharge Exam: Blood pressure 102/64, pulse 74, temperature 98.2 F (36.8 C), temperature source Oral, resp. rate 15, height 5\' 6"  (1.676 m), weight 61.2 kg (135 lb), SpO2 100 %. General appearance: alert and cooperative Cardio: regular rate and rhythm, S1, S2 normal, no murmur, click, rub or gallop GI: soft, non-tender; bowel sounds normal; no masses,  no organomegaly  Disposition: Final discharge disposition not confirmed   Allergies as of 10/27/2016      Reactions   Sulfa Antibiotics Rash      Medication List    TAKE these medications   ALIGN 4 MG Caps Take 1 capsule (4 mg total) by mouth daily.   amoxicillin-clavulanate 875-125 MG tablet Commonly known as:   AUGMENTIN Take 1 tablet by mouth 2 (two) times daily.   calcium-vitamin D 500-200 MG-UNIT tablet Commonly known as:  OSCAL WITH D Take 1 tablet by mouth daily with breakfast.   clonazePAM 0.5 MG tablet Commonly known as:  KLONOPIN Take 0.25 mg by mouth at bedtime as needed (sleep).   multivitamin with minerals Tabs tablet Take 1 tablet by mouth daily.      Follow-up Information    surgeon. Schedule an appointment as soon as possible for a visit in 2 week(s).   Why:  will need to f/u with local surgeon upon arrival back home.          Signed: Rosario Price., Gabrielle Price 10/27/2016, 8:41 AM

## 2016-10-27 NOTE — Progress Notes (Signed)
Discharged from floor ambulatory for transport home to [place] by car. Belongings & daughters with pt. No changes in assessment. Sola Margolis, [employer]

## 2016-10-27 NOTE — Discharge Summary (Signed)
Pt was admitted to [facility] on 10/22/2016, and was under our care until 10/27/2016.    She will be able to return to work on 10/30/2016, with the restrictions of no heavy lifting >25# x 4 weeks.

## 2016-12-21 NOTE — Addendum Note (Signed)
Addendum  created 12/21/16 0813 by Duane Boston, MD   Sign clinical note

## 2017-08-17 IMAGING — CT CT ABD-PELV W/ CM
2 of 5 series · 16 of 46 positions shown, 18 images · IV contrast (APPLIED)
Comparison: 10/22/2016

CLINICAL DATA: Right-sided abdominal pain and fever. Three days
postop from appendectomy for acute appendicitis.

EXAM:
CT ABDOMEN AND PELVIS WITH CONTRAST
TECHNIQUE: Multidetector CT imaging of the abdomen and pelvis was performed
using the standard protocol following bolus administration of
intravenous contrast.
CONTRAST:  100mL GHEAVQ-HOO IOPAMIDOL (GHEAVQ-HOO) INJECTION 61%

[Series 2: axial st · axial · 0.82mm/px · z∈[+1184,+1559]mm · 13 of 87 slices shown, 15 images]
[im 6/87  soft-tissue]
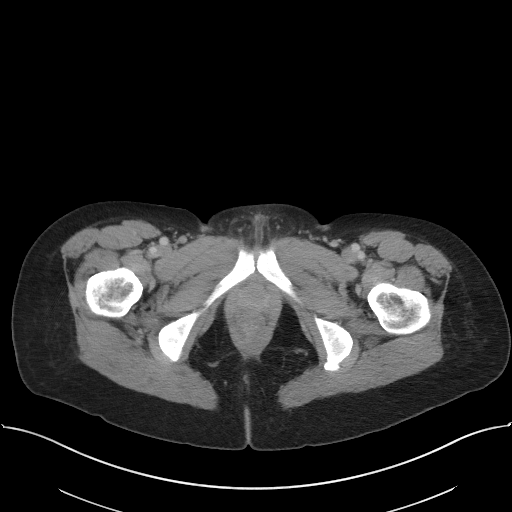
[im 6/87  bone]
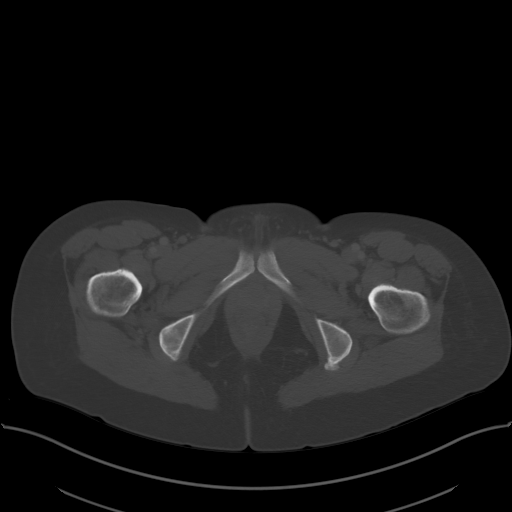
[im 12/87  soft-tissue]
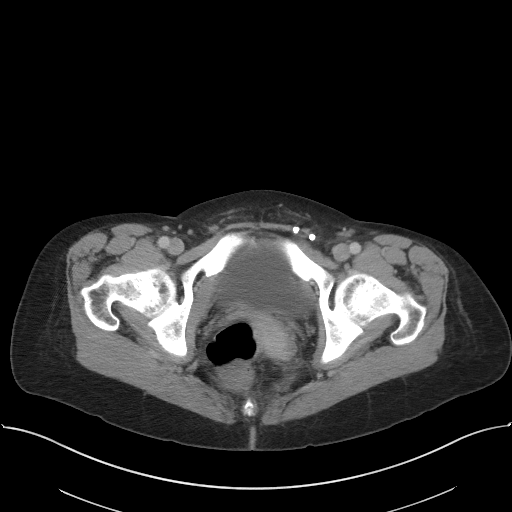
[im 18/87  soft-tissue]
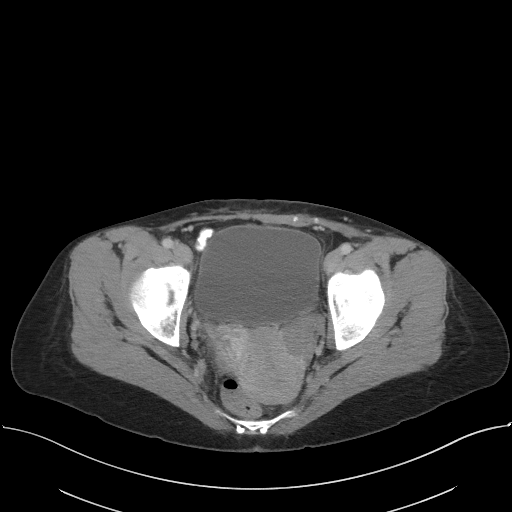
[im 23/87  soft-tissue]
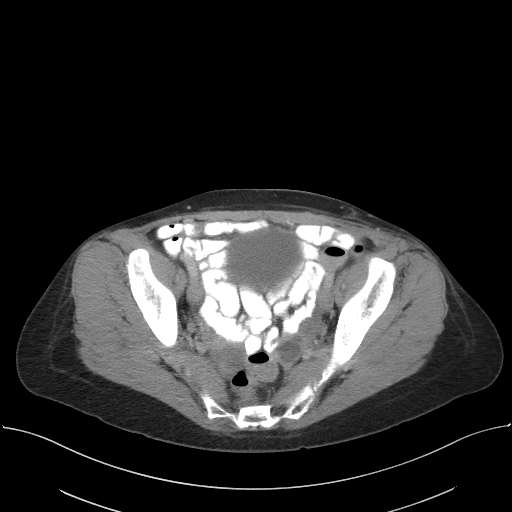
[im 29/87  soft-tissue]
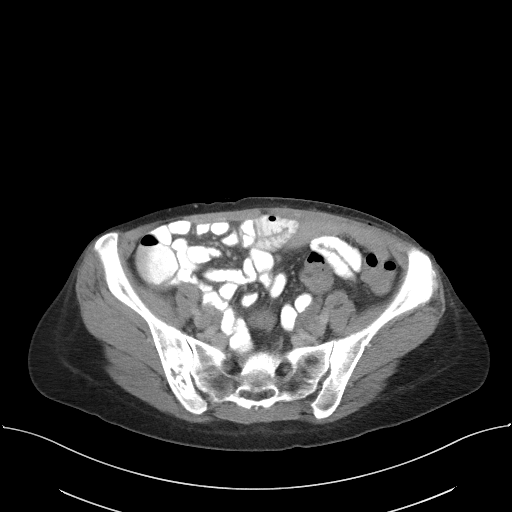
[im 35/87  soft-tissue]
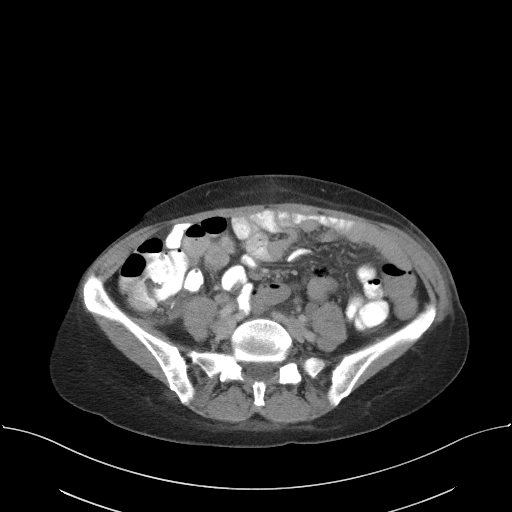
[im 46/87  soft-tissue]
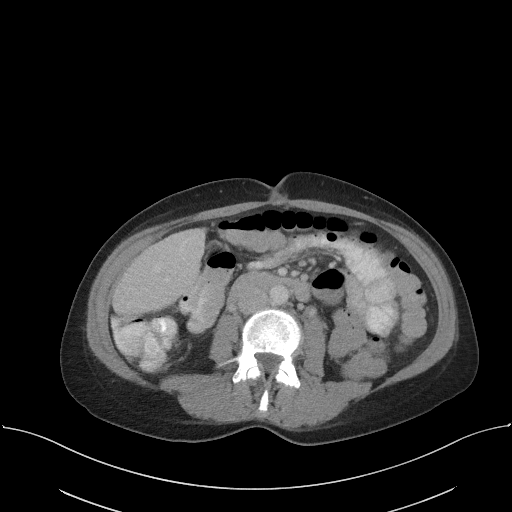
[im 52/87  soft-tissue]
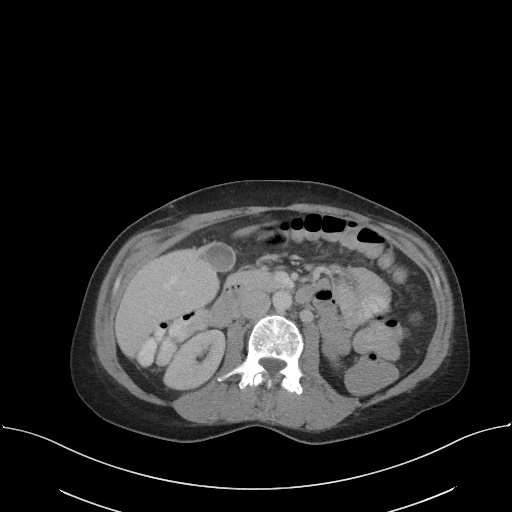
[im 58/87  soft-tissue]
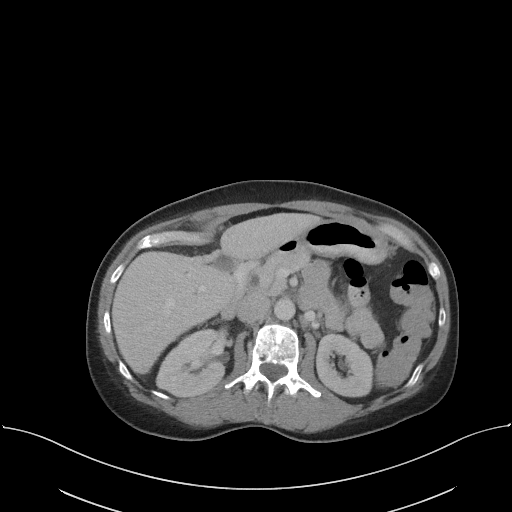
[im 58/87  bone]
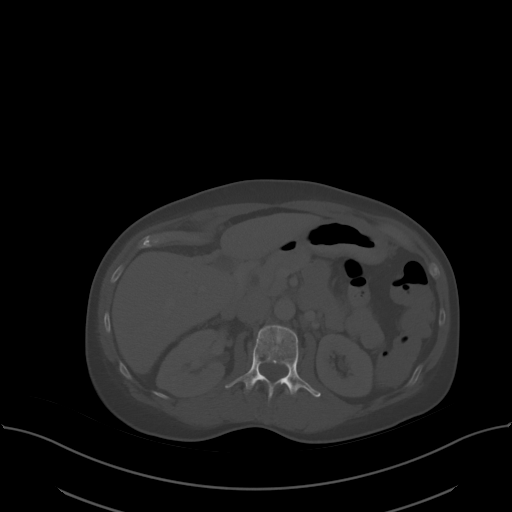
[im 64/87  soft-tissue]
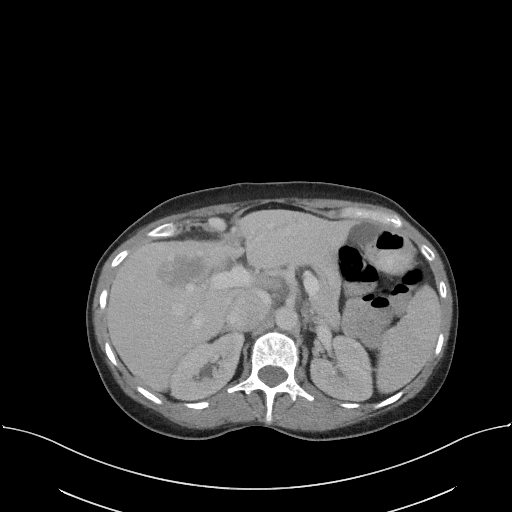
[im 69/87  soft-tissue]
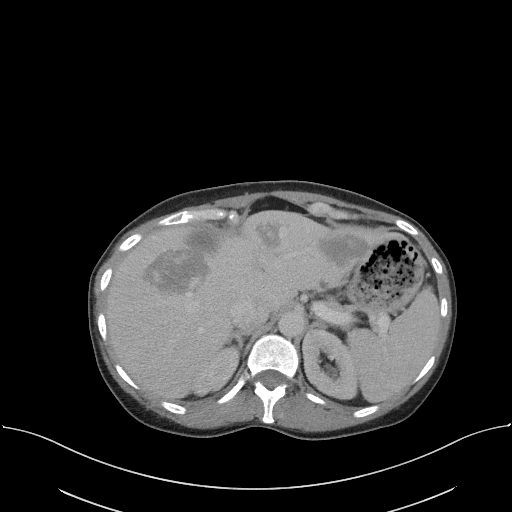
[im 75/87  soft-tissue]
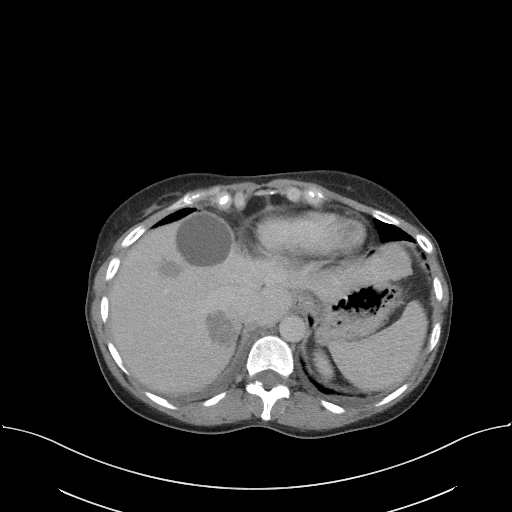
[im 81/87  soft-tissue]
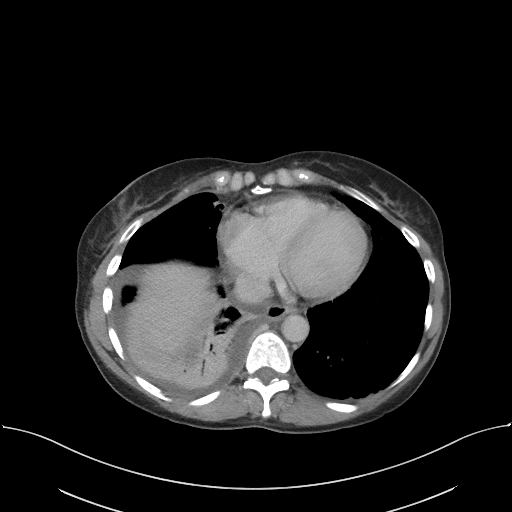

[Series 5: coronal st · coronal · 0.68mm/px · 3 of 88 slices shown]
[im 30/88  soft-tissue]
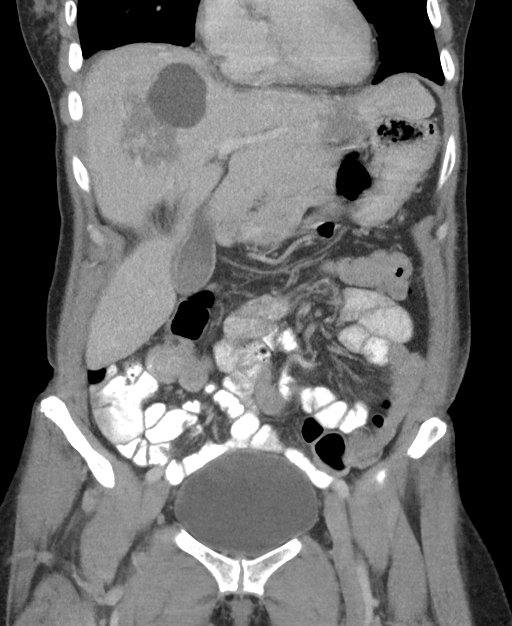
[im 39/88  soft-tissue]
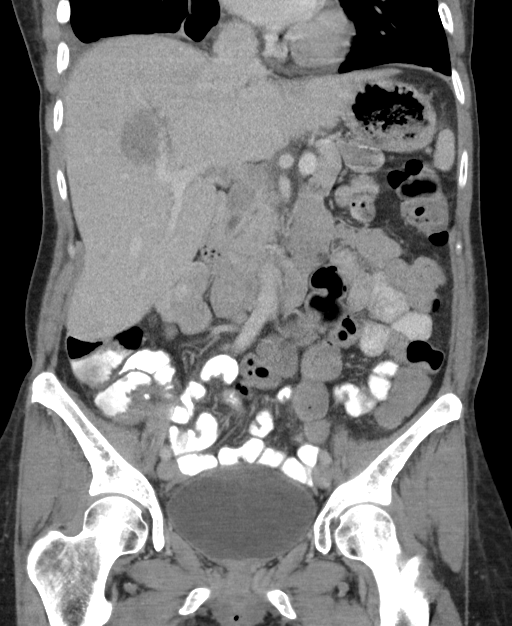
[im 49/88  soft-tissue]
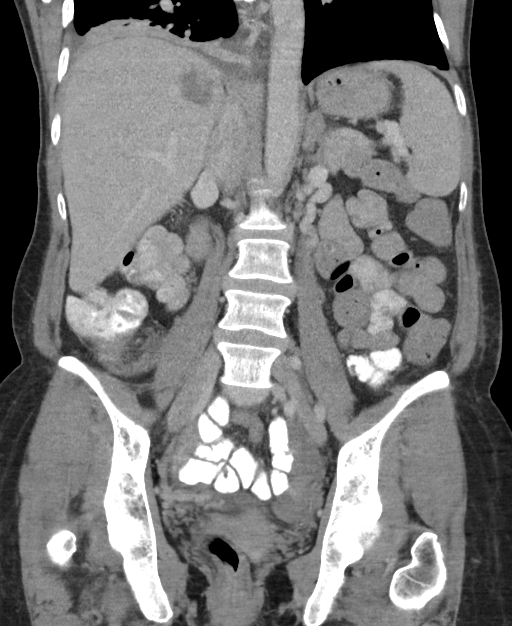

[16 of 46 positions shown; findings below may reference images not displayed]

FINDINGS: Lower Chest: Tiny right pleural effusion and right basilar
atelectasis has increased since previous study.

Hepatobiliary: Multiple benign hepatic hemangiomas are again seen,
largest in the anterior right lobe measuring 5.7 cm. Two cysts are
also seen mediolateral segments of the left lobe, largest measuring
4.7 cm.

Some layering gallbladder sludge is seen, however there is no
evidence of acute cholecystitis or biliary ductal dilatation.

Pancreas:  No mass or inflammatory changes.

Spleen: Within normal limits in size and appearance.

Adrenals/Urinary Tract: No masses identified. No evidence of
hydronephrosis. Unremarkable unopacified urinary bladder.

Stomach/Bowel: Expected postoperative change from recent
appendectomy. No evidence of abscess or bowel obstruction.

Vascular/Lymphatic: No pathologically enlarged lymph nodes. No
abdominal aortic aneurysm.

Reproductive:  No mass or other significant abnormality.

Other: Surgical mesh noted in the left inguinal region. No evidence
of recurrent hernia.

Musculoskeletal:  No suspicious bone lesions identified.
IMPRESSION: Expected postoperative changes from recent appendectomy. No evidence
of abscess, bowel obstruction, or other complication.

Increased right basilar atelectasis and tiny right pleural effusion.

Stable multiple benign hepatic hemangiomas and cysts.
